# Patient Record
Sex: Male | Born: 1950 | Race: White | Hispanic: No | Marital: Married | Smoking: Never smoker
Health system: Southern US, Community
[De-identification: ages and names within clinical notes are randomized; demographics above are authoritative.]

## PROBLEM LIST (undated history)

## (undated) DIAGNOSIS — E119 Type 2 diabetes mellitus without complications: Secondary | ICD-10-CM

## (undated) DIAGNOSIS — K219 Gastro-esophageal reflux disease without esophagitis: Secondary | ICD-10-CM

## (undated) DIAGNOSIS — I10 Essential (primary) hypertension: Secondary | ICD-10-CM

## (undated) DIAGNOSIS — E785 Hyperlipidemia, unspecified: Secondary | ICD-10-CM

## (undated) HISTORY — PX: VASECTOMY: SHX75

## (undated) HISTORY — DX: Gastro-esophageal reflux disease without esophagitis: K21.9

## (undated) HISTORY — PX: CARPAL TUNNEL RELEASE: SHX101

## (undated) HISTORY — DX: Essential (primary) hypertension: I10

## (undated) HISTORY — PX: BREAST SURGERY: SHX581

## (undated) HISTORY — DX: Type 2 diabetes mellitus without complications: E11.9

## (undated) HISTORY — PX: ROTATOR CUFF REPAIR: SHX139

## (undated) HISTORY — DX: Hyperlipidemia, unspecified: E78.5

---

## 2004-06-17 DIAGNOSIS — I252 Old myocardial infarction: Secondary | ICD-10-CM

## 2004-06-17 HISTORY — DX: Old myocardial infarction: I25.2

## 2011-10-31 DIAGNOSIS — I259 Chronic ischemic heart disease, unspecified: Secondary | ICD-10-CM | POA: Insufficient documentation

## 2013-01-05 DIAGNOSIS — M25579 Pain in unspecified ankle and joints of unspecified foot: Secondary | ICD-10-CM | POA: Insufficient documentation

## 2013-01-05 DIAGNOSIS — M19079 Primary osteoarthritis, unspecified ankle and foot: Secondary | ICD-10-CM | POA: Insufficient documentation

## 2016-01-10 DIAGNOSIS — E119 Type 2 diabetes mellitus without complications: Secondary | ICD-10-CM | POA: Insufficient documentation

## 2016-01-10 DIAGNOSIS — I10 Essential (primary) hypertension: Secondary | ICD-10-CM | POA: Insufficient documentation

## 2016-01-10 DIAGNOSIS — J301 Allergic rhinitis due to pollen: Secondary | ICD-10-CM | POA: Insufficient documentation

## 2016-01-10 DIAGNOSIS — Z794 Long term (current) use of insulin: Secondary | ICD-10-CM

## 2016-01-10 DIAGNOSIS — E559 Vitamin D deficiency, unspecified: Secondary | ICD-10-CM | POA: Insufficient documentation

## 2016-01-10 DIAGNOSIS — N400 Enlarged prostate without lower urinary tract symptoms: Secondary | ICD-10-CM | POA: Insufficient documentation

## 2016-01-10 DIAGNOSIS — K219 Gastro-esophageal reflux disease without esophagitis: Secondary | ICD-10-CM | POA: Insufficient documentation

## 2016-01-10 DIAGNOSIS — G4733 Obstructive sleep apnea (adult) (pediatric): Secondary | ICD-10-CM | POA: Insufficient documentation

## 2016-01-10 DIAGNOSIS — E785 Hyperlipidemia, unspecified: Secondary | ICD-10-CM | POA: Insufficient documentation

## 2016-01-10 DIAGNOSIS — Z955 Presence of coronary angioplasty implant and graft: Secondary | ICD-10-CM | POA: Insufficient documentation

## 2017-10-14 ENCOUNTER — Ambulatory Visit: Payer: Self-pay | Admitting: Podiatry

## 2017-10-31 ENCOUNTER — Encounter: Payer: Self-pay | Admitting: Podiatry

## 2017-10-31 ENCOUNTER — Ambulatory Visit: Payer: Medicare PPO | Admitting: Podiatry

## 2017-10-31 DIAGNOSIS — M2142 Flat foot [pes planus] (acquired), left foot: Secondary | ICD-10-CM

## 2017-10-31 DIAGNOSIS — M25572 Pain in left ankle and joints of left foot: Secondary | ICD-10-CM

## 2017-10-31 DIAGNOSIS — M79675 Pain in left toe(s): Secondary | ICD-10-CM

## 2017-10-31 DIAGNOSIS — M25571 Pain in right ankle and joints of right foot: Secondary | ICD-10-CM | POA: Diagnosis not present

## 2017-10-31 DIAGNOSIS — E119 Type 2 diabetes mellitus without complications: Secondary | ICD-10-CM

## 2017-10-31 DIAGNOSIS — B351 Tinea unguium: Secondary | ICD-10-CM | POA: Diagnosis not present

## 2017-10-31 DIAGNOSIS — G8929 Other chronic pain: Secondary | ICD-10-CM | POA: Diagnosis not present

## 2017-10-31 DIAGNOSIS — M79674 Pain in right toe(s): Secondary | ICD-10-CM | POA: Diagnosis not present

## 2017-10-31 DIAGNOSIS — M2141 Flat foot [pes planus] (acquired), right foot: Secondary | ICD-10-CM

## 2017-10-31 NOTE — Progress Notes (Signed)
Subjective:    Patient ID: Todd Parks, male    DOB: 07/07/1950, 67 y.o.   MRN: [id number]  HPI 67 year old male presents the office with concerns of thick, painful, elongated toenails that he cannot trim himself.  Denies any redness or drainage or any swelling from the toenail sites.  He also has secondary concerns that he has some occasional ankle pain which is been ongoing for quite some time.  He said no recent treatment for this.  No recent injury.  He has no other concerns today.   Review of Systems  All other systems reviewed and are negative.  History reviewed. No pertinent past medical history.  History reviewed. No pertinent surgical history.   Current Outpatient Medications:  .  chlorpheniramine (CHLOR-TRIMETON) 4 MG tablet, TAKE 1 TABLET TWICE A DAY AS NEEDED, Disp: , Rfl:  .  metoprolol succinate (TOPROL-XL) 100 MG 24 hr tablet, TAKE 1 TABLET BY MOUTH EVERY DAY, Disp: , Rfl:  .  nitroGLYCERIN (NITROSTAT) 0.4 MG SL tablet, Place under the tongue., Disp: , Rfl:  .  aspirin EC 81 MG tablet, Take by mouth., Disp: , Rfl:  .  cetirizine (ZYRTEC) 10 MG tablet, Take by mouth., Disp: , Rfl:  .  Clobetasol Propionate 0.05 % shampoo, APPLY TO AFFECTED AREA EVERY DAY, Disp: , Rfl: 3 .  fenofibrate (TRICOR) 145 MG tablet, Take 145 mg by mouth daily., Disp: , Rfl: 2 .  INVOKANA 300 MG TABS tablet, Take 300 mg by mouth daily., Disp: , Rfl: 1 .  LEVEMIR FLEXTOUCH 100 UNIT/ML Pen, INJECT 35 UNITS DAILY, Disp: , Rfl: 3 .  metFORMIN (GLUCOPHAGE) 1000 MG tablet, TAKE 1 TABLET (1,000 MG TOTAL) BY MOUTH 2 TIMES DAILY WITH MEALS., Disp: , Rfl: 0 .  rosuvastatin (CRESTOR) 5 MG tablet, Take 5 mg by mouth daily., Disp: , Rfl: 0 .  valsartan (DIOVAN) 160 MG tablet, Take 160 mg by mouth daily., Disp: , Rfl: 3  No Known Allergies      Objective:   Physical Exam  General: AAO x3, NAD  Dermatological: Nails are hypertrophic, dystrophic, brittle, discolored, elongated 10. No surrounding redness  or drainage. Tenderness nails 1-5 bilaterally. No open lesions or pre-ulcerative lesions are identified today.  Vascular: Dorsalis Pedis artery and Posterior Tibial artery pedal pulses are 2/4 bilateral with immedate capillary fill time. Pedal hair growth present. No varicosities and no lower extremity edema present bilateral. There is no pain with calf compression, swelling, warmth, erythema.   Neruologic: Grossly intact via light touch bilateral.Protective threshold with Semmes Wienstein monofilament intact to all pedal sites bilateral.  Negative Tinel sign.  Musculoskeletal: Flatfoot deformity is present.  There is no area pinpoint bony tenderness pain to vibratory sensation.  Ankle joint range of motion intact however subjectively he gets some pain to the anterior aspect of the ankle joint.  There is no pain in the sinus tarsi.  There is no other area of tenderness identified except the toenails.  Muscular strength 5/5 in all groups tested bilateral.  Gait: Unassisted, Nonantalgic.     Assessment & Plan:  67 year old male with symptomatic onychomycosis, ankle pain likely result of flatfoot deformity, biomechanical changes -Treatment options discussed including all alternatives, risks, and complications -Etiology of symptoms were discussed -Nails debrided 10 without complications or bleeding.  We discussed treatment options for onychomycosis.  He elects to proceed with treatment will likely do a topical treatment. -Regards to his ankle pain I think that this is coming from his flat feet  which is quite significant.  I do recommend a more supportive shoe.  He does state that he wears a more supportive shoe he feels better we discussed the change in shoes to help with this. -Daily foot inspection -Follow-up in 9 weeks or sooner if any problems arise. In the meantime, encouraged to call the office with any questions, concerns, change in symptoms.   Ovid Curd, DPM

## 2017-10-31 NOTE — Patient Instructions (Signed)

## 2017-11-26 ENCOUNTER — Telehealth: Payer: Self-pay | Admitting: Podiatry

## 2017-11-26 MED ORDER — NONFORMULARY OR COMPOUNDED ITEM: 2 refills | Status: DC

## 2017-11-26 NOTE — Telephone Encounter (Signed)
Or[facility] faxed to [facility] for onychomycosis nail lacquer [facility] order.

## 2017-11-26 NOTE — Telephone Encounter (Signed)
This is Todd CriglerDiana Parks calling for my husband. Dr. [name] was supposed to order some cream that goes on his toenails and it is supposed to be delivered to our house. He still has not received it. Could you please check into what is going on and call him back at [phone] and let him know what is going on? Thank you. Goodbye.

## 2017-11-26 NOTE — Addendum Note (Signed)
Addended by: Alphia Kava'CONNELL, VALERY D on: 11/26/2017 11:53 AM   Modules accepted: Orders

## 2017-11-26 NOTE — Telephone Encounter (Signed)
I informed pt and wife, I had not been given the nail lacquer order, so I would fax it now as a [facility] order and I gave pt's wife the [facility] [phone].

## 2018-01-06 ENCOUNTER — Ambulatory Visit: Payer: Medicare PPO | Admitting: Podiatry

## 2018-01-06 ENCOUNTER — Encounter: Payer: Self-pay | Admitting: Podiatry

## 2018-01-06 DIAGNOSIS — B351 Tinea unguium: Secondary | ICD-10-CM

## 2018-01-06 DIAGNOSIS — M2141 Flat foot [pes planus] (acquired), right foot: Secondary | ICD-10-CM | POA: Diagnosis not present

## 2018-01-06 DIAGNOSIS — M2142 Flat foot [pes planus] (acquired), left foot: Secondary | ICD-10-CM

## 2018-01-06 DIAGNOSIS — M25572 Pain in left ankle and joints of left foot: Secondary | ICD-10-CM

## 2018-01-06 DIAGNOSIS — E119 Type 2 diabetes mellitus without complications: Secondary | ICD-10-CM

## 2018-01-06 DIAGNOSIS — M79674 Pain in right toe(s): Secondary | ICD-10-CM | POA: Diagnosis not present

## 2018-01-06 DIAGNOSIS — M79675 Pain in left toe(s): Secondary | ICD-10-CM | POA: Diagnosis not present

## 2018-01-06 DIAGNOSIS — G8929 Other chronic pain: Secondary | ICD-10-CM

## 2018-01-06 DIAGNOSIS — M25571 Pain in right ankle and joints of right foot: Secondary | ICD-10-CM

## 2018-01-07 NOTE — Progress Notes (Signed)
Subjective: [age] year old male presents the office today for follow-up evaluation of ankle pain.  He states he purchase new shoes and this is because his ankle swelling quite some time he is very happy with the pain improvement.  He also presents today for concerns of thick, painful, elongated toenails that he cannot trim himself.  Denies any redness or drainage or any swelling to the toenails.  He has no new concerns otherwise. Denies any systemic complaints such as fevers, chills, nausea, vomiting. No acute changes since last appointment, and no other complaints at this time.   Objective: AAO x3, NAD DP/PT pulses palpable bilaterally, CRT less than 3 seconds Toes are hypertrophic, dystrophic, discolored, elongated x10.  Tenderness nails 1-5 bilaterally.  No surrounding redness or drainage or any signs of infection. There is decrease in medial arch upon weightbearing.  This time there is no tenderness palpation of the ankle and there is no pain with ankle or subtalar joint range of motion. No open lesions or pre-ulcerative lesions.  No pain with calf compression, swelling, warmth, erythema  Assessment: Resolving ankle pain with flatfoot deformity; symptomatic onychomycosis  Plan: -All treatment options discussed with the patient including all alternatives, risks, complications.  -Ingal is doing much better.  We discussed custom orthotics reason to consider this and if he wants to do them he is on call the office to make an appointment with Raiford Nobleick.  This is before supper deformity and ankle pain.  So far the change in shoes has been good for him. -Debridement nails x10 without any complications or bleeding. -Patient encouraged to call the office with any questions, concerns, change in symptoms.   Vivi BarrackMatthew R Wagoner DPM

## 2018-03-10 ENCOUNTER — Ambulatory Visit: Payer: Medicare PPO | Admitting: Podiatry

## 2018-03-10 DIAGNOSIS — M79674 Pain in right toe(s): Secondary | ICD-10-CM | POA: Diagnosis not present

## 2018-03-10 DIAGNOSIS — E119 Type 2 diabetes mellitus without complications: Secondary | ICD-10-CM

## 2018-03-10 DIAGNOSIS — B351 Tinea unguium: Secondary | ICD-10-CM

## 2018-03-10 DIAGNOSIS — M79675 Pain in left toe(s): Secondary | ICD-10-CM

## 2018-03-10 NOTE — Progress Notes (Signed)
Subjective: 67 year old male presents the office today for concerns of thick, painful, elongated toenails that he cannot trim himself.  Denies any redness or drainage or any swelling to the toenails.  He has no new concerns otherwise. Denies any systemic complaints such as fevers, chills, nausea, vomiting. No acute changes since last appointment, and no other complaints at this time.   Objective: AAO x3, NAD DP/PT pulses palpable bilaterally, CRT less than 3 seconds Toes are hypertrophic, dystrophic, discolored, elongated x10.  Tenderness nails 1-5 bilaterally.  No surrounding redness or drainage or any signs of infection. There is decrease in medial arch upon weightbearing.  This time there is no tenderness palpation of the ankle and there is no pain with ankle or subtalar joint range of motion. Overall his foot pain has much improved.  No open lesions or pre-ulcerative lesions.  No pain with calf compression, swelling, warmth, erythema  Assessment: Resolving ankle pain with flatfoot deformity; symptomatic onychomycosis  Plan: -All treatment options discussed with the patient including all alternatives, risks, complications.  -Debridement nails x10 without any complications or bleeding. -Will check orthotic coverage due to flatfoot/diabetes -Patient encouraged to call the office with any questions, concerns, change in symptoms.   Vivi BarrackMatthew R Wagoner DPM

## 2018-05-12 ENCOUNTER — Encounter: Payer: Self-pay | Admitting: Podiatry

## 2018-05-12 ENCOUNTER — Ambulatory Visit: Payer: Medicare PPO | Admitting: Podiatry

## 2018-05-12 ENCOUNTER — Encounter (INDEPENDENT_AMBULATORY_CARE_PROVIDER_SITE_OTHER): Payer: Self-pay

## 2018-05-12 ENCOUNTER — Ambulatory Visit (INDEPENDENT_AMBULATORY_CARE_PROVIDER_SITE_OTHER): Payer: Medicare PPO

## 2018-05-12 DIAGNOSIS — M79675 Pain in left toe(s): Secondary | ICD-10-CM

## 2018-05-12 DIAGNOSIS — M779 Enthesopathy, unspecified: Secondary | ICD-10-CM

## 2018-05-12 DIAGNOSIS — M7752 Other enthesopathy of left foot: Secondary | ICD-10-CM | POA: Diagnosis not present

## 2018-05-12 DIAGNOSIS — M19072 Primary osteoarthritis, left ankle and foot: Secondary | ICD-10-CM

## 2018-05-12 DIAGNOSIS — M79674 Pain in right toe(s): Secondary | ICD-10-CM

## 2018-05-12 DIAGNOSIS — E119 Type 2 diabetes mellitus without complications: Secondary | ICD-10-CM

## 2018-05-12 DIAGNOSIS — B351 Tinea unguium: Secondary | ICD-10-CM

## 2018-05-12 MED ORDER — TRIAMCINOLONE ACETONIDE 10 MG/ML IJ SUSP
10.0000 mg | Freq: Once | INTRAMUSCULAR | Status: AC
  Administered 2018-05-12: 10 mg

## 2018-05-12 NOTE — Progress Notes (Signed)
Subjective: 67 year old male presents the office today for concerns of thick, painful, elongated toenails that he cannot trim himself.  Denies any redness or drainage or any swelling to the toenails.  He also states his been having some pain to the left ankle.  He states that he was wearing his orthotics in his shoes and he is doing good but unfortunately worsened dress shoes at a funeral and since he has had pain he points on the anterior ankle joint line.  Denies any recent injury or trauma.  He has no new concerns otherwise. Denies any systemic complaints such as fevers, chills, nausea, vomiting. No acute changes since last appointment, and no other complaints at this time.   Objective: AAO x3, NAD DP/PT pulses palpable bilaterally, CRT less than 3 seconds Toes are hypertrophic, dystrophic, discolored, elongated x10.  Tenderness nails 1-5 bilaterally.  No surrounding redness or drainage or any signs of infection. Tenderness on the anterior ankle joint along along both anterior medial anterior lateral gutters of the ankle.  There is no pain or crepitation with ankle joint range of motion.  Absent range of motion of the subtalar joint.  No other areas of tenderness. No open lesions or pre-ulcerative lesions.  No pain with calf compression, swelling, warmth, erythema  Assessment: Capsulitis left ankle; symptomatic onychomycosis  Plan: -All treatment options discussed with the patient including all alternatives, risks, complications.  -X-rays obtained reviewed.  Significant arthritic changes present in the subtalar joint.  No evidence of acute fracture identified. -Patient is requesting steroid injection of the left ankle.  See procedure note below.  He has an ankle brace at home and I want him to wear this. -Debridement nails x10 without any complications or bleeding. -He still think about orthotics I think long-term this would be helpful. -Patient encouraged to call the office with any  questions, concerns, change in symptoms.   Procedure: Injection intermediate joint Discussed alternatives, risks, complications and verbal consent was obtained.  Location: Left ankle anterior lateral approach Skin Prep: Betadine. Injectate: 0.5cc 0.5% marcaine plain, 0.5 cc 2% lidocaine plain and, 1 cc kenalog 10. Disposition: Patient tolerated procedure well. Injection site dressed with a band-aid.  Post-injection care was discussed and return precautions discussed.    Vivi BarrackMatthew R Wagoner DPM

## 2018-07-14 ENCOUNTER — Telehealth: Payer: Self-pay | Admitting: *Deleted

## 2018-07-14 ENCOUNTER — Ambulatory Visit: Payer: Medicare PPO | Admitting: Podiatry

## 2018-07-14 DIAGNOSIS — M19072 Primary osteoarthritis, left ankle and foot: Secondary | ICD-10-CM | POA: Diagnosis not present

## 2018-07-14 DIAGNOSIS — B351 Tinea unguium: Secondary | ICD-10-CM | POA: Diagnosis not present

## 2018-07-14 DIAGNOSIS — M7752 Other enthesopathy of left foot: Secondary | ICD-10-CM | POA: Diagnosis not present

## 2018-07-14 DIAGNOSIS — M79674 Pain in right toe(s): Secondary | ICD-10-CM

## 2018-07-14 DIAGNOSIS — M79675 Pain in left toe(s): Secondary | ICD-10-CM

## 2018-07-14 DIAGNOSIS — E119 Type 2 diabetes mellitus without complications: Secondary | ICD-10-CM | POA: Diagnosis not present

## 2018-07-14 NOTE — Telephone Encounter (Signed)
-----   Message from Vivi Barrack, DPM sent at 07/14/2018  9:46 AM EST ----- Can you please order an MRI of the left ankle due to chronic pain/arthritis. This is for surgical planning. Thanks.

## 2018-07-14 NOTE — Progress Notes (Signed)
Subjective: 68 year old male presents the office today for follow-up evaluation of left ankle pain.  States the injection helped for about 1.5 days and the pain started to come back.  He states that his ankle hurts quite a bit the morning he is on it but gets better with rest he has no pain today.  He states that he also has quite a bit of pain on the outside aspect of the foot if he does side to side motions or goes up a hill.  This is been a chronic issue for him he was discussed other treatment options.  Also his nails are thickened elongated and cause pain with pressure in shoes.  Denies any redness or drainage from the toenail sites.Denies any systemic complaints such as fevers, chills, nausea, vomiting. No acute changes since last appointment, and no other complaints at this time.   Objective: AAO x3, NAD DP/PT pulses palpable bilaterally, CRT less than 3 seconds There is decreased range of motion of the subtalar joint.  There is tenderness subjectively along the anterior ankle joint line there is no tenderness today.  Flexor, extensor tendons appear to be intact.  There is no edema, erythema. Nails are hypertrophic, dystrophic with yellow-brown discoloration.  Tenderness nails 1-5 bilaterally.  No edema, erythema, drainage or pus or any clinical signs of infection noted today. No open lesions or pre-ulcerative lesions.  No pain with calf compression, swelling, warmth, erythema  Assessment: 68 year old male with left ankle capsulitis, arthritis with subtalar joint arthritis; symptomatic onychomycosis  Plan: -All treatment options discussed with the patient including all alternatives, risks, complications.  -Sharp debrided the nails x10 without any complications or bleeding. -At this time given his continuation of pain will order an MRI of the left ankle.  This is for surgical planning.  He has had chronic pain to his left ankle despite numerous treatments he continues to have pain. -Patient  encouraged to call the office with any questions, concerns, change in symptoms.   Vivi Barrack DPM

## 2018-07-14 NOTE — Telephone Encounter (Signed)
Orders to J. Quintana, RN for pre-cert, faxed to [facility]. 

## 2018-07-27 ENCOUNTER — Ambulatory Visit
Admission: RE | Admit: 2018-07-27 | Discharge: 2018-07-27 | Disposition: A | Discharge status: Home / Self Care | Payer: Medicare PPO | Source: Ambulatory Visit | Attending: Podiatry | Admitting: Podiatry

## 2018-08-06 ENCOUNTER — Telehealth: Payer: Self-pay | Admitting: *Deleted

## 2018-08-06 NOTE — Telephone Encounter (Signed)
-----   Message from Vivi Barrack, DPM sent at 08/05/2018  5:12 PM EST ----- Val- please let him know the MRI does show significant arthritis in the joint below the ankle (which we alrady know). It didn't comment much about the ankle. Can you please send for an overread and please let him know that we are going to do that? Thanks.

## 2018-08-06 NOTE — Telephone Encounter (Signed)
I informed pt of Dr. Gabriel Rung review of results and request to send copy of the MRI disc to a radiology specialist for evaluation of the ankle and more details treatment planning, there would be a 10-15 day delay in final results and I would call again with instructions once final results are received.

## 2018-08-06 NOTE — Telephone Encounter (Signed)
Mailed copy of MRI to [facility].

## 2018-08-17 ENCOUNTER — Encounter: Payer: Self-pay | Admitting: Podiatry

## 2018-08-19 ENCOUNTER — Telehealth: Payer: Self-pay | Admitting: Podiatry

## 2018-08-19 NOTE — Telephone Encounter (Signed)
Dr Germaine Pomfret called patient  yesterday. Return call from patient.  I informed him  that Dr was in surgery today and he stated that he should await a reply from you at [employer].

## 2018-08-20 NOTE — Telephone Encounter (Signed)
I called him to go over the results.   Marchelle Folks, can you please schedule him for a steroid injection?

## 2018-08-31 ENCOUNTER — Ambulatory Visit: Payer: Medicare PPO | Admitting: Podiatry

## 2018-08-31 ENCOUNTER — Other Ambulatory Visit: Payer: Self-pay

## 2018-08-31 ENCOUNTER — Encounter: Payer: Self-pay | Admitting: Podiatry

## 2018-08-31 DIAGNOSIS — M19072 Primary osteoarthritis, left ankle and foot: Secondary | ICD-10-CM

## 2018-08-31 DIAGNOSIS — M7752 Other enthesopathy of left foot: Secondary | ICD-10-CM | POA: Diagnosis not present

## 2018-08-31 NOTE — Progress Notes (Signed)
Subjective: 68 year old male presents the office today for steroid injections in his left subtalar joint.  I discussed with him over the phone about doing this.  He is interested in having subtalar joint fusion but not until at least later this year or early next year.  I would like to do a steroid injection to see if this helps improve his symptoms and also help with some pain relief. Denies any systemic complaints such as fevers, chills, nausea, vomiting. No acute changes since last appointment, and no other complaints at this time.   Objective: AAO x3, NAD DP/PT pulses palpable bilaterally, CRT less than 3 seconds Significant restriction subtalar range of motion and there is tenderness on the lateral aspect along sinus tarsi.  No edema there is no significant erythema increased warmth ankle joint.  No other areas of tenderness identified. No open lesions. No pain with calf compression, swelling, warmth, erythema.  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  -Steroid injection left subtalar joint.  See procedure note below. -Ankle brace as needed -Patient encouraged to call the office with any questions, concerns, change in symptoms.   Procedure: Injection intermediate joint Discussed alternatives, risks, complications and verbal consent was obtained.  Location: Left sinus tarsi Skin Prep: Betadine  Injectate: 0.5cc 0.5% marcaine plain, 0.5 cc 2% lidocaine plain and, 1 cc kenalog 10. Disposition: Patient tolerated procedure well. Injection site dressed with a band-aid.  Post-injection care was discussed and return precautions discussed.   Vivi Barrack DPM

## 2018-10-13 ENCOUNTER — Ambulatory Visit: Payer: Medicare PPO | Admitting: Podiatry

## 2018-10-13 ENCOUNTER — Other Ambulatory Visit: Payer: Self-pay

## 2018-10-13 ENCOUNTER — Encounter: Payer: Self-pay | Admitting: Podiatry

## 2018-10-13 VITALS — Temp 97.5°F

## 2018-10-13 DIAGNOSIS — M79674 Pain in right toe(s): Secondary | ICD-10-CM

## 2018-10-13 DIAGNOSIS — B351 Tinea unguium: Secondary | ICD-10-CM | POA: Diagnosis not present

## 2018-10-13 DIAGNOSIS — M79675 Pain in left toe(s): Secondary | ICD-10-CM

## 2018-10-13 DIAGNOSIS — E119 Type 2 diabetes mellitus without complications: Secondary | ICD-10-CM | POA: Diagnosis not present

## 2018-10-13 NOTE — Progress Notes (Signed)
Subjective: 68 year old male presents the office today for concerns of thick, discolored toenails that he cannot trim himself as well as calluses. He also states that his left foot is still hurting.  He does not want proceed with her injection because he gets increased pain afterwards.  He is also tried the braces is not been helpful and wants to consider surgery for his left foot in the future.  His wife is having surgery on June 1 and wants to wait till her back surgery is complete.  Still gets pain on a daily basis. Denies any systemic complaints such as fevers, chills, nausea, vomiting. No acute changes since last appointment, and no other complaints at this time.   Objective: AAO x3, NAD DP/PT pulses palpable bilaterally, CRT less than 3 seconds Nails are hypertrophic, dystrophic, brittle, discolored, elongated 10. No surrounding redness or drainage. Tenderness nails 1-5 bilaterally. No open lesions or pre-ulcerative lesions are identified today. Hyperkeratotic lesion submetatarsal 1 bilaterally both medial hallux.  No ongoing ulceration.  Decreased range of motion of the subtalar joint left side with tenderness palpation of the sinus tarsi and with subtalar joint range of motion.  Minimal discomfort but no joint.  Mild discomfort in the plantar aspect of the heel. No open lesions or pre-ulcerative lesions.  No pain with calf compression, swelling, warmth, erythema  Assessment: 68 year old male with symptomatic onychomycosis, Hyperkeratotic lesion, subtalar joint arthritis  Plan: -All treatment options discussed with the patient including all alternatives, risks, complications.  -Nails debrided x10 without any complications or bleeding -Hyperkeratotic lesion sub-debrided.  Small nonbleeding right foot metatarsal 1.  No signs of infection.  Areas cleaned with alcohol.  Antibiotic ointment and bandages applied.  Monitoring signs or symptoms of infection. -Discussed left foot pain. He wants to  hold off on another injection. Discussed STJ fusion but he wants to wait until his wife has her surgery. Will discuss further next appointment.  -Patient encouraged to call the office with any questions, concerns, change in symptoms.   Vivi Barrack DPM

## 2019-01-12 ENCOUNTER — Ambulatory Visit: Payer: Medicare PPO | Admitting: Podiatry

## 2019-01-19 ENCOUNTER — Other Ambulatory Visit: Payer: Self-pay

## 2019-01-19 ENCOUNTER — Encounter: Payer: Self-pay | Admitting: Podiatry

## 2019-01-19 ENCOUNTER — Ambulatory Visit: Payer: Medicare PPO | Admitting: Podiatry

## 2019-01-19 VITALS — Temp 97.4°F

## 2019-01-19 DIAGNOSIS — M79674 Pain in right toe(s): Secondary | ICD-10-CM | POA: Diagnosis not present

## 2019-01-19 DIAGNOSIS — Q828 Other specified congenital malformations of skin: Secondary | ICD-10-CM

## 2019-01-19 DIAGNOSIS — M79675 Pain in left toe(s): Secondary | ICD-10-CM

## 2019-01-19 DIAGNOSIS — B351 Tinea unguium: Secondary | ICD-10-CM

## 2019-01-19 DIAGNOSIS — E119 Type 2 diabetes mellitus without complications: Secondary | ICD-10-CM | POA: Diagnosis not present

## 2019-01-20 NOTE — Progress Notes (Signed)
Subjective: 68 year old male presents the office today for concerns of thick, discolored toenails that he cannot trim himself as well as calluses. He also states that his left foot is still hurting however he has been on Celebrex for back issue which is been helping the foot.  He wants to consider surgery later this year.  No recent changes otherwise no recent injuries to his lower extremities.Denies any systemic complaints such as fevers, chills, nausea, vomiting. No acute changes since last appointment, and no other complaints at this time.   Objective: AAO x3, NAD DP/PT pulses palpable bilaterally, CRT less than 3 seconds Nails are hypertrophic, dystrophic, brittle, discolored, elongated 10. No surrounding redness or drainage. Tenderness nails 1-5 bilaterally. No open lesions or pre-ulcerative lesions are identified today. Hyperkeratotic lesion submetatarsal 1 bilaterally both medial hallux.  No ongoing ulceration.   Decreased range of motion of the subtalar joint left side with tenderness palpation of the sinus tarsi and with subtalar joint range of motion.  Minimal discomfort but no joint.  Mild discomfort in the plantar aspect of the heel.  Overall improved as of now as he is on Celebrex. No open lesions or pre-ulcerative lesions.  No pain with calf compression, swelling, warmth, erythema  Assessment: 68 year old male with symptomatic onychomycosis, Hyperkeratotic lesion, subtalar joint arthritis  Plan: -All treatment options discussed with the patient including all alternatives, risks, complications.  -Nails debrided x10 without any complications or bleeding -Hyperkeratotic lesion sub-debrided.  Small nonbleeding right foot metatarsal 1.  No signs of infection.  Areas cleaned with alcohol.  Antibiotic ointment and bandages applied.  Monitoring signs or symptoms of infection. -Consider surgical invention, STJ fusion.  We discussed this.  Will consider doing this later this year. -Patient  encouraged to call the office with any questions, concerns, change in symptoms.   Trula Slade DPM

## 2019-01-21 DIAGNOSIS — M545 Low back pain, unspecified: Secondary | ICD-10-CM | POA: Insufficient documentation

## 2019-01-25 DIAGNOSIS — M47816 Spondylosis without myelopathy or radiculopathy, lumbar region: Secondary | ICD-10-CM | POA: Insufficient documentation

## 2019-04-16 ENCOUNTER — Other Ambulatory Visit: Payer: Self-pay

## 2019-04-16 DIAGNOSIS — Z20822 Contact with and (suspected) exposure to covid-19: Secondary | ICD-10-CM

## 2019-04-17 LAB — NOVEL CORONAVIRUS, NAA: SARS-CoV-2, NAA: NOT DETECTED

## 2019-04-27 ENCOUNTER — Encounter: Payer: Self-pay | Admitting: Podiatry

## 2019-04-27 ENCOUNTER — Ambulatory Visit: Payer: Medicare PPO | Admitting: Podiatry

## 2019-04-27 ENCOUNTER — Other Ambulatory Visit: Payer: Self-pay | Admitting: *Deleted

## 2019-04-27 ENCOUNTER — Other Ambulatory Visit: Payer: Self-pay

## 2019-04-27 DIAGNOSIS — E119 Type 2 diabetes mellitus without complications: Secondary | ICD-10-CM | POA: Diagnosis not present

## 2019-04-27 DIAGNOSIS — B351 Tinea unguium: Secondary | ICD-10-CM

## 2019-04-27 DIAGNOSIS — M79674 Pain in right toe(s): Secondary | ICD-10-CM

## 2019-04-27 DIAGNOSIS — Z01812 Encounter for preprocedural laboratory examination: Secondary | ICD-10-CM | POA: Diagnosis not present

## 2019-04-27 DIAGNOSIS — M19072 Primary osteoarthritis, left ankle and foot: Secondary | ICD-10-CM | POA: Diagnosis not present

## 2019-04-27 DIAGNOSIS — M79675 Pain in left toe(s): Secondary | ICD-10-CM

## 2019-04-27 DIAGNOSIS — M2141 Flat foot [pes planus] (acquired), right foot: Secondary | ICD-10-CM | POA: Diagnosis not present

## 2019-04-27 DIAGNOSIS — M2142 Flat foot [pes planus] (acquired), left foot: Secondary | ICD-10-CM

## 2019-04-27 DIAGNOSIS — M150 Primary generalized (osteo)arthritis: Secondary | ICD-10-CM

## 2019-04-27 NOTE — Progress Notes (Signed)
I sent a request to Jolee Ewing, [place], requesting assistance in getting a knee scooter for Mr. Dominic.  He's scheduled for surgery on 05/26/2019.

## 2019-04-27 NOTE — Patient Instructions (Signed)
Pre-Operative Instructions  Congratulations, you have decided to take an important step towards improving your quality of life.  You can be assured that the doctors and staff at [facility] will be with you every step of the way.  Here are some important things you should know:  1. Plan to be at the surgery center/hospital at least 1 (one) hour prior to your scheduled time, unless otherwise directed by the surgical center/hospital staff.  You must have a responsible adult accompany you, remain during the surgery and drive you home.  Make sure you have directions to the surgical center/hospital to ensure you arrive on time. 2. If you are having surgery at [facility] or [facility] hospitals, you will need a copy of your medical history and physical form from your family physician within one month prior to the date of surgery. We will give you a form for your primary physician to complete.  3. We make every effort to accommodate the date you request for surgery.  However, there are times where surgery dates or times have to be moved.  We will contact you as soon as possible if a change in schedule is required.   4. No aspirin/ibuprofen for one week before surgery.  If you are on aspirin, any non-steroidal anti-inflammatory medications (Mobic, Aleve, Ibuprofen) should not be taken seven (7) days prior to your surgery.  You make take Tylenol for pain prior to surgery.  5. Medications - If you are taking daily heart and blood pressure medications, seizure, reflux, allergy, asthma, anxiety, pain or diabetes medications, make sure you notify the surgery center/hospital before the day of surgery so they can tell you which medications you should take or avoid the day of surgery. 6. No food or drink after midnight the night before surgery unless directed otherwise by surgical center/hospital staff. 7. No alcoholic beverages 24-hours prior to surgery.  No smoking 24-hours prior or 24-hours after  surgery. 8. Wear loose pants or shorts. They should be loose enough to fit over bandages, boots, and casts. 9. Don't wear slip-on shoes. Sneakers are preferred. 10. Bring your boot with you to the surgery center/hospital.  Also bring crutches or a walker if your physician has prescribed it for you.  If you do not have this equipment, it will be provided for you after surgery. 11. If you have not been contacted by the surgery center/hospital by the day before your surgery, call to confirm the date and time of your surgery. 12. Leave-time from work may vary depending on the type of surgery you have.  Appropriate arrangements should be made prior to surgery with your employer. 13. Prescriptions will be provided immediately following surgery by your doctor.  Fill these as soon as possible after surgery and take the medication as directed. Pain medications will not be refilled on weekends and must be approved by the doctor. 14. Remove nail polish on the operative foot and avoid getting pedicures prior to surgery. 15. Wash the night before surgery.  The night before surgery wash the foot and leg well with water and the antibacterial soap provided. Be sure to pay special attention to beneath the toenails and in between the toes.  Wash for at least three (3) minutes. Rinse thoroughly with water and dry well with a towel.  Perform this wash unless told not to do so by your physician.  Enclosed: 1 Ice pack (please put in freezer the night before surgery)   1 Hibiclens skin cleaner     Pre-op instructions  If you have any questions regarding the instructions, please do not hesitate to call our office.  [place]: [address], [place], [place] [address] -- [phone]  [place]: [address] [place], [place] [address] -- [phone]  [place]: [address]  [place], [place] [address] -- [phone]   Website: [email or web address] 

## 2019-05-04 ENCOUNTER — Encounter: Payer: Self-pay | Admitting: *Deleted

## 2019-05-04 NOTE — Progress Notes (Signed)
Subjective: 68 year old male with past medical history consistent for vitamin D deficiency, type 2 diabetes presents the office today for surgical consultation given continued pain to his left foot.  He states that anytime he is on uneven surfaces he gets quite a bit of pinpoint the lateral aspect of his foot on the sinus tarsi.  He is attempted shoe modifications, bracing and this is been ongoing for quite some time.  Given the progressive nature of the pain he wants to go and proceed with surgery.  Also asking for his nails be trimmed today they are thick and elongated he cannot trim them himself. Denies any systemic complaints such as fevers, chills, nausea, vomiting. No acute changes since last appointment, and no other complaints at this time.   Objective: AAO x3, NAD DP/PT pulses palpable bilaterally, CRT less than 3 seconds Nails are hypertrophic, dystrophic, discolored with yellow-brown discoloration.  Tenderness nails 1-5 bilaterally.  No drainage or pus or any signs of infection.   There is tenderness to palpation on the lateral aspect of the foot on the sinus tarsi.  Flatfoot deformities present.  There is decrease in the motion of the subtalar joint.  No significant discomfort to palpation of the ankle joint itself there is no pain or crepitation with ankle joint range of motion.  There is no other areas of tenderness. No open lesions or pre-ulcerative lesions.  No pain with calf compression, swelling, warmth, erythema  Assessment: Left subtalar joint arthritis, symptomatic onychomycosis  Plan: -All treatment options discussed with the patient including all alternatives, risks, complications.  -Toenail sharply debrided x10 without any complications or bleeding -Reviewed the x-rays as well as the previous MRI.  We discussed subtalar joint arthrodesis.  Discussed the surgery was postoperative course and was proceed. -The incision placement as well as the postoperative course was discussed  with the patient. I discussed risks of the surgery which include, but not limited to, infection, bleeding, pain, swelling, need for further surgery, delayed or nonhealing, painful or ugly scar, numbness or sensation changes, over/under correction, recurrence, transfer lesions, further deformity, hardware failure, DVT/PE, loss of toe/foot. Patient understands these risks and wishes to proceed with surgery. The surgical consent was reviewed with the patient all 3 pages were signed. No promises or guarantees were given to the outcome of the procedure. All questions were answered to the best of my ability. Before the surgery the patient was encouraged to call the office if there is any further questions. The surgery will be performed at the [facility] on an outpatient basis. -Blood work ordered including vitamin D, A1c, CBC, CMP -Patient encouraged to call the office with any questions, concerns, change in symptoms.   Trula Slade DPM

## 2019-05-04 NOTE — Progress Notes (Unsigned)
Per Dr. Jacqualyn Posey, I sent Dr. Loa Socks a surgical medical clearance request letter.  Todd Parks is scheduled for surgery on 05/26/2019.

## 2019-05-12 ENCOUNTER — Telehealth: Payer: Self-pay | Admitting: *Deleted

## 2019-05-12 NOTE — Telephone Encounter (Signed)
DOS 05/26/2019 SUBTALAR ARTHRODESIS LEFT FOOT - 10175  [employer]: Eligibility Date -    Co-Payment - Surgical  In Network Individual  [facility] [place] INCLUDED IN OOP  $200.00 Visit   Co-Insurance - Surgical  In [place]  [facility] Surgery  0 %   Deductible - Health Benefit Plan Coverage  Network Not Applicable Individual  Plan / Coverage Date Jun 17, 2018 - Jun 17, 2019  Benefit Date Jun 17, 2018 - Jun 17, 2019 $0.00 Calendar Year  - $0.00 Year to Date  $0.00 Remaining    Out of [place]ocket (Stop Loss) - Health Benefit Plan Coverage  In Network Individual  Plan / Coverage Date Jun 17, 2018 - Jun 17, 2019  Benefit Date Jun 17, 2018 - Jun 17, 2019 $4,200.00   In Network Individual  [phone] Remaining

## 2019-05-17 ENCOUNTER — Encounter: Payer: Self-pay | Admitting: Podiatry

## 2019-05-17 NOTE — Telephone Encounter (Signed)
I called and spoke to [facility] at [facility].  He stated authorization is NOT REQUIRED. Call ref# [id number]

## 2019-05-19 LAB — COMPLETE METABOLIC PANEL WITH GFR
AG Ratio: 1.9 (calc) (ref 1.0–2.5)
ALT: 26 U/L (ref 9–46)
AST: 21 U/L (ref 10–35)
Albumin: 4.7 g/dL (ref 3.6–5.1)
Alkaline phosphatase (APISO): 33 U/L — ABNORMAL LOW (ref 35–144)
BUN/Creatinine Ratio: 12 (calc) (ref 6–22)
BUN: 15 mg/dL (ref 7–25)
CO2: 27 mmol/L (ref 20–32)
Calcium: 9.7 mg/dL (ref 8.6–10.3)
Chloride: 103 mmol/L (ref 98–110)
Creat: 1.29 mg/dL — ABNORMAL HIGH (ref 0.70–1.25)
GFR, Est African American: 66 mL/min/{1.73_m2} (ref >=60)
GFR, Est Non African American: 57 mL/min/{1.73_m2} — ABNORMAL LOW (ref >=60)
Globulin: 2.5 g/dL (calc) (ref 1.9–3.7)
Glucose, Bld: 122 mg/dL — ABNORMAL HIGH (ref 65–99)
Potassium: 4.4 mmol/L (ref 3.5–5.3)
Sodium: 140 mmol/L (ref 135–146)
Total Bilirubin: 0.5 mg/dL (ref 0.2–1.2)
Total Protein: 7.2 g/dL (ref 6.1–8.1)

## 2019-05-19 LAB — CBC WITH DIFFERENTIAL/PLATELET
Absolute Monocytes: 372 cells/uL (ref 200–950)
Basophils Absolute: 39 cells/uL (ref 0–200)
Basophils Relative: 0.8 %
Eosinophils Absolute: 108 cells/uL (ref 15–500)
Eosinophils Relative: 2.2 %
HCT: 43.3 % (ref 38.5–50.0)
Hemoglobin: 14.6 g/dL (ref 13.2–17.1)
Lymphs Abs: 1803 cells/uL (ref 850–3900)
MCH: 29.6 pg (ref 27.0–33.0)
MCHC: 33.7 g/dL (ref 32.0–36.0)
MCV: 87.8 fL (ref 80.0–100.0)
MPV: 10.5 fL (ref 7.5–12.5)
Monocytes Relative: 7.6 %
Neutro Abs: 2577 cells/uL (ref 1500–7800)
Neutrophils Relative %: 52.6 %
Platelets: 198 10*3/uL (ref 140–400)
RBC: 4.93 10*6/uL (ref 4.20–5.80)
RDW: 13 % (ref 11.0–15.0)
Total Lymphocyte: 36.8 %
WBC: 4.9 10*3/uL (ref 3.8–10.8)

## 2019-05-19 LAB — HEMOGLOBIN A1C
Hgb A1c MFr Bld: 6.4 % of total Hgb — ABNORMAL HIGH (ref <5.7)
Mean Plasma Glucose: 137 (calc)
eAG (mmol/L): 7.6 (calc)

## 2019-05-19 LAB — VITAMIN D 25 HYDROXY (VIT D DEFICIENCY, FRACTURES): Vit D, 25-Hydroxy: 39 ng/mL (ref 30–100)

## 2019-05-21 ENCOUNTER — Telehealth: Payer: Self-pay | Admitting: *Deleted

## 2019-05-21 NOTE — Telephone Encounter (Signed)
We received medical clearance from Dr. Loa Socks.  He said for [name] to hold his Levemir Insulin, Invokana, and Metformin the day of surgery.  I called and informed [name] of Dr. Madlyn Frankel instructions.  He said he would do it.  He said he hasn't taken the Aspirin in about a week now on advise of someone from the surgery center.

## 2019-05-24 ENCOUNTER — Telehealth: Payer: Self-pay | Admitting: *Deleted

## 2019-05-24 NOTE — Telephone Encounter (Addendum)
"  [name] has called her and stated that we are supposed to have a wheel chair here for him to use after he has his surgery.  Do you know anything about that?"  He's supposed to have a knee scooter.  I put in an order with [facility].  Have they not contacted him.  "I don't know but I don't think he's going to like using a knee scooter."  I'll give him a call.   I received a call from the surgical center.  I was informed that you were inquiring about a wheel chair.  "I absolutely did not call them.  I have a knee cycle or whatever you call it.  Dr. Jacqualyn Posey suggested I use it.  I'm doing what the doctor told me to do."  Okay, that's great.  I hope all goes well with your surgery.

## 2019-05-26 ENCOUNTER — Other Ambulatory Visit: Payer: Self-pay | Admitting: Podiatry

## 2019-05-26 ENCOUNTER — Encounter: Payer: Self-pay | Admitting: Podiatry

## 2019-05-26 ENCOUNTER — Telehealth: Payer: Self-pay | Admitting: *Deleted

## 2019-05-26 DIAGNOSIS — S86392A Other injury of muscle(s) and tendon(s) of peroneal muscle group at lower leg level, left leg, initial encounter: Secondary | ICD-10-CM

## 2019-05-26 DIAGNOSIS — M05572 Rheumatoid polyneuropathy with rheumatoid arthritis of left ankle and foot: Secondary | ICD-10-CM

## 2019-05-26 MED ORDER — PROMETHAZINE HCL 25 MG PO TABS: 25.0000 mg | ORAL_TABLET | Freq: Three times a day (TID) | ORAL | 0 refills | Status: DC | PRN

## 2019-05-26 MED ORDER — OXYCODONE-ACETAMINOPHEN 5-325 MG PO TABS: 1.0000 | ORAL_TABLET | Freq: Four times a day (QID) | ORAL | 0 refills | Status: DC | PRN

## 2019-05-26 MED ORDER — CEPHALEXIN 500 MG PO CAPS: 500.0000 mg | ORAL_CAPSULE | Freq: Three times a day (TID) | ORAL | 0 refills | Status: DC

## 2019-05-26 NOTE — Telephone Encounter (Signed)
I called pt and told him I was calling as an post op check to see how he was doing. Pt states he is doing fine. I asked pt how he was doing on the pain medication and he stated he had an episode of nausea and took a nausea pill. I told pt he should take the nausea pill about 15-30 minutes prior to the pain medication and then take the pain medication with a light non-greasy, non-acidy food. I told pt that if he could tolerate OTC ibuprofen he could take 800mg  3 times a day to help prolong the pain medication effects. Pt states understanding.

## 2019-05-26 NOTE — Telephone Encounter (Signed)
Pt's wife, Beverlee Nims called states pt doesn't know she is calling but he had a spell of nausea and the oxycodone may have been too strong, and he took a nausea medication at about 12:15pm.

## 2019-05-26 NOTE — Progress Notes (Signed)
Postop medications sent 

## 2019-05-26 NOTE — Telephone Encounter (Signed)
I left message on pt's wife, Diana's phone informing that pt should take the nausea medication about 15-30 minute before the pain medication and take the pain medication with a light non-greasy snack like toast, and that I would contact pt and pose as a post op check.

## 2019-05-27 ENCOUNTER — Encounter: Payer: Medicare PPO | Admitting: Podiatry

## 2019-05-27 ENCOUNTER — Telehealth: Payer: Self-pay | Admitting: *Deleted

## 2019-05-27 NOTE — Telephone Encounter (Signed)
Called and spoke with the patient and the patient stated that he was doing better today with the nausea medicine and that the pain on the side of the foot was a about a 4 on the pain scale and was icing and elevating and the surgery itself went smooth and I stated to the patient to call with any concerns or questions and that we would see the patient next week for the post op visit. Todd Parks

## 2019-06-03 ENCOUNTER — Other Ambulatory Visit: Payer: Self-pay

## 2019-06-03 ENCOUNTER — Encounter: Payer: Self-pay | Admitting: Podiatry

## 2019-06-03 ENCOUNTER — Ambulatory Visit (INDEPENDENT_AMBULATORY_CARE_PROVIDER_SITE_OTHER): Payer: Medicare PPO

## 2019-06-03 ENCOUNTER — Ambulatory Visit (INDEPENDENT_AMBULATORY_CARE_PROVIDER_SITE_OTHER): Payer: Medicare PPO | Admitting: Podiatry

## 2019-06-03 DIAGNOSIS — E119 Type 2 diabetes mellitus without complications: Secondary | ICD-10-CM

## 2019-06-03 DIAGNOSIS — Z9889 Other specified postprocedural states: Secondary | ICD-10-CM

## 2019-06-03 DIAGNOSIS — M19072 Primary osteoarthritis, left ankle and foot: Secondary | ICD-10-CM | POA: Diagnosis not present

## 2019-06-06 NOTE — Progress Notes (Signed)
Subjective: Todd Parks is a 68 y.o. is seen today in office s/p left STJ fusion preformed on 05/26/2019.  He states he is doing well he is not taking any pain medication currently.  He has been taking extra strength Tylenol as needed.  Is been try to keep the foot iced and elevated he has not been putting weight on his foot denies any recent injury or falls.  Denies any systemic complaints such as fevers, chills, nausea, vomiting. No calf pain, chest pain, shortness of breath.   Objective: General: No acute distress, AAOx3  DP/PT pulses palpable 2/4, CRT < 3 sec to all digits.  Protective sensation intact. Motor function intact.  LEFT foot: Incision is well coapted without any evidence of dehiscence with sutures, staples intact. There is no surrounding erythema, ascending cellulitis, fluctuance, crepitus, malodor, drainage/purulence. There is mild edema around the surgical site. There is minimal pain along the surgical site.  Foot is in rectus position No other areas of tenderness to bilateral lower extremities.  No other open lesions or pre-ulcerative lesions.  No pain with calf compression, swelling, warmth, erythema.   Assessment and Plan:  Status post left foot subtalar joint fusion, doing well with no complications   -Treatment options discussed including all alternatives, risks, and complications -X-rays obtained reviewed.  Hardware intact status post subtalar joint fusion.  No evidence of acute fracture. -Antibiotic ointment and a dressing applied.  Keep the dressing clean, dry, intact. -Swelling has come down.  However given the neuropathy as well still some swelling will hold off on a cast to prevent any further issues but I want him to remain in the cam boot at all times and treat this like a cast and do not remove this except ice when he is awake. -Nonweightbearing -Ice/elevation -Pain medication as needed. -Monitor for any clinical signs or symptoms of infection and DVT/PE and  directed to call the office immediately should any occur or go to the ER. -Follow-up as scheduled for possible suture removal and cast application or sooner if any problems arise. In the meantime, encouraged to call the office with any questions, concerns, change in symptoms.   Celesta Gentile, DPM

## 2019-06-08 ENCOUNTER — Encounter: Payer: Medicare PPO | Admitting: Podiatry

## 2019-06-17 ENCOUNTER — Other Ambulatory Visit: Payer: Self-pay

## 2019-06-17 ENCOUNTER — Ambulatory Visit (INDEPENDENT_AMBULATORY_CARE_PROVIDER_SITE_OTHER): Payer: Medicare PPO | Admitting: Podiatry

## 2019-06-17 DIAGNOSIS — Z9889 Other specified postprocedural states: Secondary | ICD-10-CM | POA: Diagnosis not present

## 2019-06-17 DIAGNOSIS — M19072 Primary osteoarthritis, left ankle and foot: Secondary | ICD-10-CM

## 2019-06-17 DIAGNOSIS — E119 Type 2 diabetes mellitus without complications: Secondary | ICD-10-CM

## 2019-06-17 NOTE — Progress Notes (Signed)
Subjective: Todd Parks is a 68 y.o. is seen today in office s/p left STJ fusion preformed on 05/26/2019.  He states he has been doing well and is been feeling great.  Took pain medicine only for 1 day after surgery and since then only takes Tylenol as needed.  He has been in the cam boot has been icing and elevating.  Denies any injury or falls.  Denies any fevers, chills, nausea, vomiting.  No calf pain, chest pain, shortness of breath and he has no new concerns otherwise.    Objective: General: No acute distress, AAOx3  DP/PT pulses palpable 2/4, CRT < 3 sec to all digits.  Protective sensation intact. Motor function intact.  LEFT foot: Incision is well coapted without any evidence of dehiscence with sutures, staples intact. There is no surrounding erythema, ascending cellulitis, fluctuance, crepitus, malodor, drainage/purulence. There is proved edema around the surgical site. There is no significant pain along the surgical site.  Foot is in rectus position No other areas of tenderness to bilateral lower extremities.  No other open lesions or pre-ulcerative lesions.  No pain with calf compression, swelling, warmth, erythema.   Assessment and Plan:  Status post left foot subtalar joint fusion, doing well with no complications   -Treatment options discussed including all alternatives, risks, and complications -Sutures removed today without complications.  I left the staples intact.  Antibiotic ointment and a dressing applied.  A well-padded below-knee fiberglass cast was applied making sure to pad all bony prominences. -Continue nonweightbearing -Ice/elevation -Pain medication as needed. -Monitor for any clinical signs or symptoms of infection and DVT/PE and directed to call the office immediately should any occur or go to the ER. -Follow-up as scheduled for possible suture removal and cast application or sooner if any problems arise. In the meantime, encouraged to call the office with any  questions, concerns, change in symptoms.   Celesta Gentile, DPM

## 2019-06-22 ENCOUNTER — Encounter: Payer: Medicare PPO | Admitting: Podiatry

## 2019-06-28 ENCOUNTER — Other Ambulatory Visit: Payer: Self-pay

## 2019-06-28 ENCOUNTER — Ambulatory Visit (INDEPENDENT_AMBULATORY_CARE_PROVIDER_SITE_OTHER): Payer: Medicare PPO | Admitting: Podiatry

## 2019-06-28 ENCOUNTER — Encounter: Payer: Self-pay | Admitting: Podiatry

## 2019-06-28 DIAGNOSIS — E119 Type 2 diabetes mellitus without complications: Secondary | ICD-10-CM

## 2019-06-28 DIAGNOSIS — M19072 Primary osteoarthritis, left ankle and foot: Secondary | ICD-10-CM

## 2019-06-28 DIAGNOSIS — Z9889 Other specified postprocedural states: Secondary | ICD-10-CM

## 2019-07-02 NOTE — Progress Notes (Signed)
Subjective: Todd Parks is a 69 y.o. is seen today in office s/p left STJ fusion preformed on 05/26/2019.  He presents today for staple removal.  He states he is doing well with any discomfort he feels that the surgery has been a success so far.  He has no concerns otherwise.  Denies any fevers, chills, nausea, vomiting.  No calf pain, chest pain, shortness of breath.    Objective: General: No acute distress, AAOx3  DP/PT pulses palpable 2/4, CRT < 3 sec to all digits.  Protective sensation intact. Motor function intact.  LEFT foot: Incision is well coapted without any evidence of dehiscence with staples intact. There is no surrounding erythema, ascending cellulitis, fluctuance, crepitus, malodor, drainage/purulence. There is mild edema around the surgical site. There is no pain along the surgical site.  Foot is in rectus position No other areas of tenderness to bilateral lower extremities.  No other open lesions or pre-ulcerative lesions.  No pain with calf compression, swelling, warmth, erythema.   Assessment and Plan:  Status post left foot subtalar joint fusion, doing well with no complications   -Treatment options discussed including all alternatives, risks, and complications -Cast was removed today.  The staples were removed and incision was healing well.  Antibiotic ointment was applied followed by dry sterile dressing.  A well-padded below-knee fiberglass cast was applied making sure to pad all bony prominences.  Continue nonweightbearing ice elevation.  Return in about 2 weeks (around 07/12/2019).  Repeat x-rays next appointment likely transition to cam boot   Vivi Barrack DPM

## 2019-07-12 ENCOUNTER — Other Ambulatory Visit: Payer: Self-pay

## 2019-07-12 ENCOUNTER — Encounter: Payer: Self-pay | Admitting: Podiatry

## 2019-07-12 ENCOUNTER — Ambulatory Visit (INDEPENDENT_AMBULATORY_CARE_PROVIDER_SITE_OTHER): Payer: Medicare PPO

## 2019-07-12 ENCOUNTER — Ambulatory Visit (INDEPENDENT_AMBULATORY_CARE_PROVIDER_SITE_OTHER): Payer: Medicare PPO | Admitting: Podiatry

## 2019-07-12 DIAGNOSIS — E119 Type 2 diabetes mellitus without complications: Secondary | ICD-10-CM

## 2019-07-12 DIAGNOSIS — M19072 Primary osteoarthritis, left ankle and foot: Secondary | ICD-10-CM

## 2019-07-12 DIAGNOSIS — Z9889 Other specified postprocedural states: Secondary | ICD-10-CM

## 2019-07-12 NOTE — Progress Notes (Signed)
Subjective: Todd Parks is a 69 y.o. is seen today in office s/p left STJ fusion preformed on 05/26/2019.  States that he is doing well when he is ready get out of the cast.  He feels that he is doing really well and feels that he can start walking.  Denies any recent injury or falls.  He has no other concerns today. Denies any fevers, chills, nausea, vomiting.  No calf pain, chest pain, shortness of breath.    Objective: General: No acute distress, AAOx3  DP/PT pulses palpable 2/4, CRT < 3 sec to all digits.  LEFT foot: Cast is clean, dry, intact.  Incision is well coapted without any evidence and scabs overlying the incision.  There is dry, flaky skin present.  No open lesions.  Minimal swelling.  There is no erythema or warmth.  No other areas of tenderness to bilateral lower extremities.  No other open lesions or pre-ulcerative lesions.  No pain with calf compression, swelling, warmth, erythema.   Assessment and Plan:  Status post left foot subtalar joint fusion, doing well with no complications   -Treatment options discussed including all alternatives, risks, and complications -X-rays obtained reviewed.  Hardware intact.  There is increased consolidation across the arthrodesis site.  There is no evidence of acute fracture. -He was placed into a cam boot today.  Discussed gentle range of motion exercises.  Continue ice elevate and continue nonweightbearing. -Monitor for any clinical signs or symptoms of infection and directed to call the office immediately should any occur or go to the ER.  Follow-up in about 2 weeks for repeat x-rays and if doing well transition to partial weightbearing.  Vivi Barrack DPM

## 2019-07-26 ENCOUNTER — Ambulatory Visit (INDEPENDENT_AMBULATORY_CARE_PROVIDER_SITE_OTHER): Payer: Medicare PPO | Admitting: Podiatry

## 2019-07-26 ENCOUNTER — Encounter: Payer: Self-pay | Admitting: Podiatry

## 2019-07-26 ENCOUNTER — Ambulatory Visit (INDEPENDENT_AMBULATORY_CARE_PROVIDER_SITE_OTHER): Payer: Medicare PPO

## 2019-07-26 ENCOUNTER — Other Ambulatory Visit: Payer: Self-pay

## 2019-07-26 DIAGNOSIS — E119 Type 2 diabetes mellitus without complications: Secondary | ICD-10-CM

## 2019-07-26 DIAGNOSIS — M19072 Primary osteoarthritis, left ankle and foot: Secondary | ICD-10-CM

## 2019-07-26 DIAGNOSIS — Z9889 Other specified postprocedural states: Secondary | ICD-10-CM

## 2019-07-28 NOTE — Progress Notes (Signed)
Subjective: Todd Parks is a 69 y.o. is seen today in office s/p left STJ fusion preformed on 05/26/2019.  He states that he is doing wonderful.  He is having no pain.  He does admit that he has gone without his boot for short time as well as been walking in the cam boot at times.  He has no discomfort with either of these activities.  No increase in swelling.  There is no other issues today.  Denies any fevers, chills, nausea, vomiting.  No calf pain, chest pain, shortness of breath.    Objective: General: No acute distress, AAOx3  DP/PT pulses palpable 2/4, CRT < 3 sec to all digits.  LEFT foot: Incision is well coapted without any evidence there is 1 small area of a scab that is loose and coming off.  I removed this today underlying wound appears to be healed.  There is no edema, erythema, drainage or pus or any signs of infection today.  There is no tenderness to palpation at surgical sites.  Subtalar joint arthrodesis site appears to be stable.  No other open lesions or pre-ulcerative lesions.  No pain with calf compression, swelling, warmth, erythema.   Assessment and Plan:  Status post left foot subtalar joint fusion, doing well with no complications   -Treatment options discussed including all alternatives, risks, and complications -X-rays obtained reviewed.  Hardware intact.  There is increased consolidation across the arthrodesis site.  There is no evidence of acute fracture. -He is doing better.  I will continue the cam boot and wear this at all times except while sleeping.  Discussed starting partial weightbearing we will start physical therapy.  Prescription for [facility] was written. -Monitor for any clinical signs or symptoms of infection and directed to call the office immediately should any occur or go to the ER.  Follow-up in about 3-4 weeks  Vivi Barrack DPM

## 2019-08-13 ENCOUNTER — Encounter: Payer: Self-pay | Admitting: Podiatry

## 2019-08-13 ENCOUNTER — Other Ambulatory Visit: Payer: Self-pay

## 2019-08-13 ENCOUNTER — Ambulatory Visit (INDEPENDENT_AMBULATORY_CARE_PROVIDER_SITE_OTHER): Payer: Medicare PPO | Admitting: Podiatry

## 2019-08-13 VITALS — Temp 97.6°F

## 2019-08-13 DIAGNOSIS — E119 Type 2 diabetes mellitus without complications: Secondary | ICD-10-CM | POA: Diagnosis not present

## 2019-08-13 DIAGNOSIS — M19072 Primary osteoarthritis, left ankle and foot: Secondary | ICD-10-CM | POA: Diagnosis not present

## 2019-08-13 DIAGNOSIS — B351 Tinea unguium: Secondary | ICD-10-CM

## 2019-08-13 DIAGNOSIS — M79674 Pain in right toe(s): Secondary | ICD-10-CM | POA: Diagnosis not present

## 2019-08-13 DIAGNOSIS — M779 Enthesopathy, unspecified: Secondary | ICD-10-CM | POA: Diagnosis not present

## 2019-08-13 DIAGNOSIS — M79675 Pain in left toe(s): Secondary | ICD-10-CM

## 2019-08-13 NOTE — Progress Notes (Signed)
Subjective: Todd Parks is a 69 y.o. is seen today in office s/p left STJ fusion preformed on 05/26/2019. He presents today walking in a CAM boot and using a cane. He has been doing physical therapy which has been helpful. He has had minimal discomfort he reports. His pain level at max is 2/10. Denies any fevers, chills, nausea, vomiting.  No calf pain, chest pain, shortness of breath.    Objective: General: No acute distress, AAOx3  DP/PT pulses palpable 2/4, CRT < 3 sec to all digits.  LEFT foot: Incision is well coapted without any evidence and a scar has formed. There is no surrounding erythema, ascending cellulitis. There is no obvious signs of infection. Minimal edema. There is no discomfort to the surgical site. Minimal discomfort to the ankle.  Nails are hypertrophic, dystrophic, brittle, discolored, elongated 10. No surrounding redness or drainage. Tenderness nails 1-5 bilaterally. No open lesions or pre-ulcerative lesions are identified today. No other open lesions or pre-ulcerative lesions.  No pain with calf compression, swelling, warmth, erythema.   Assessment and Plan:  Status post left foot subtalar joint fusion; symptomatic onychomycosis   -Treatment options discussed including all alternatives, risks, and complications -From a surgical standpoint he is doing well.  Continue physical therapy twice a week.  As he progresses with therapy we can start to transition to a regular shoe with a Tri-Lock ankle brace which was dispensed today. Continue to ice. Compression.  -Nails debrided x10 without complications or bleeding  RTC 3 weeks and will repeat x-rays next appointment.  Vivi Barrack DPM

## 2019-09-03 ENCOUNTER — Ambulatory Visit (INDEPENDENT_AMBULATORY_CARE_PROVIDER_SITE_OTHER): Payer: Medicare PPO

## 2019-09-03 ENCOUNTER — Encounter: Payer: Self-pay | Admitting: Podiatry

## 2019-09-03 ENCOUNTER — Other Ambulatory Visit: Payer: Self-pay | Admitting: Podiatry

## 2019-09-03 ENCOUNTER — Ambulatory Visit (HOSPITAL_BASED_OUTPATIENT_CLINIC_OR_DEPARTMENT_OTHER): Payer: Medicare PPO

## 2019-09-03 ENCOUNTER — Encounter (HOSPITAL_BASED_OUTPATIENT_CLINIC_OR_DEPARTMENT_OTHER): Payer: Self-pay

## 2019-09-03 ENCOUNTER — Ambulatory Visit (INDEPENDENT_AMBULATORY_CARE_PROVIDER_SITE_OTHER): Payer: Medicare PPO | Admitting: Podiatry

## 2019-09-03 ENCOUNTER — Other Ambulatory Visit: Payer: Self-pay

## 2019-09-03 VITALS — Temp 98.2°F

## 2019-09-03 DIAGNOSIS — M19072 Primary osteoarthritis, left ankle and foot: Secondary | ICD-10-CM

## 2019-09-03 DIAGNOSIS — R234 Changes in skin texture: Secondary | ICD-10-CM

## 2019-09-03 DIAGNOSIS — E119 Type 2 diabetes mellitus without complications: Secondary | ICD-10-CM

## 2019-09-03 NOTE — Progress Notes (Signed)
Subjective: Todd Parks is a 69 y.o. is seen today in office s/p left STJ fusion preformed on 05/26/2019. He presents today walking in a regular shoe and wearing the trilock. He did not wear the brace today. He currently has no pain.  He only wears the brace if he is doing a lot of walking.  Describes some discomfort if he is on his foot a lot. He is still doing physical therapy as well but feels that he can do his exercises at home.. No recent injury or falls. No new concerns. Denies any fevers, chills, nausea, vomiting.  No calf pain, chest pain, shortness of breath.    A1c (05/18/2019) 6.4 [place] D (05/18/2019)- 39  Objective: General: No acute distress, AAOx3  DP/PT pulses palpable 2/4, CRT < 3 sec to all digits.  LEFT foot: Incision is well coapted without any evidence and a scar has formed.There is no pain along the surgical site.  No pain along the course of flexor, extensor, Achilles tendon.  All the tendons appear to be intact.  There is no significant edema there is no erythema or warmth.  Range of motion intact but any restriction except of the subtalar joint which is been fused. MMT 5/5.  Small skin fissure on the heel but this is away from the area of the incision.  There is no drainage or pus or any signs of infection. No other open lesions or pre-ulcerative lesions.  No pain with calf compression, swelling, warmth, erythema.   Assessment and Plan:  Status post left foot subtalar joint fusion; heel skin fissure  -Treatment options discussed including all alternatives, risks, and complications -X-rays were obtained and independently reviewed.  Hardware intact and increased consolidation across the arthrodesis site.  Awaiting radiology review. -At this point he is doing well and is having no pain or swelling.  He is wearing a regular shoe.  Strength is adequate.  I will discharge him with a postoperative care and increase this.  I do want him to go to physical therapy 1 more time to get  him on a good home program.  He is scheduled on Tuesday for this. -Antibiotic ointment to the skin fissure.  Monitoring signs or symptoms of infection. -I will see him back around April 30 for routine care or sooner if any issues are to arise.  Vivi Barrack DPM

## 2019-10-15 ENCOUNTER — Other Ambulatory Visit: Payer: Self-pay

## 2019-10-15 ENCOUNTER — Ambulatory Visit (INDEPENDENT_AMBULATORY_CARE_PROVIDER_SITE_OTHER): Payer: Medicare PPO | Admitting: Podiatry

## 2019-10-15 ENCOUNTER — Encounter: Payer: Self-pay | Admitting: Podiatry

## 2019-10-15 VITALS — Temp 97.4°F

## 2019-10-15 DIAGNOSIS — M79675 Pain in left toe(s): Secondary | ICD-10-CM

## 2019-10-15 DIAGNOSIS — M79674 Pain in right toe(s): Secondary | ICD-10-CM

## 2019-10-15 DIAGNOSIS — B351 Tinea unguium: Secondary | ICD-10-CM | POA: Diagnosis not present

## 2019-10-15 DIAGNOSIS — E119 Type 2 diabetes mellitus without complications: Secondary | ICD-10-CM

## 2019-10-15 NOTE — Progress Notes (Signed)
Subjective: 69 y.o. returns the office today for painful, elongated, thickened toenails which he cannot trim himself. Denies any redness or drainage around the nails. Denies any acute changes since last appointment and no new complaints today. The ankle/foot has been feeling great and not having any pain or issues. He is able to do his activities without issue. Denies any systemic complaints such as fevers, chills, nausea, vomiting.   PCP: Alain Honey, MD  A1c: 6.4  Objective: AAO 3, NAD DP/PT pulses palpable, CRT less than 3 seconds Protective sensation decreased with Simms Weinstein monofilament Nails hypertrophic, dystrophic, elongated, brittle, discolored 10. There is tenderness overlying the nails 1-5 bilaterally. There is no surrounding erythema or drainage along the nail sites. No pain on the left foot surgical site. There is minimal edema but no erythema or signs of infection. No pain to the feet otherwise.  No open lesions or pre-ulcerative lesions are identified. No other areas of tenderness bilateral lower extremities. No overlying edema, erythema, increased warmth. No pain with calf compression, swelling, warmth, erythema.  Assessment: Patient presents with symptomatic onychomycosis  Plan: -Treatment options including alternatives, risks, complications were discussed -Nails sharply debrided 10 without complication/bleeding. -Discussed daily foot inspection. If there are any changes, to call the office immediately. Continue with supportive shoes. Surgical site well healed.  -Follow-up in 3 months or sooner if any problems are to arise. In the meantime, encouraged to call the office with any questions, concerns, changes symptoms.  Ovid Curd, DPM

## 2019-11-25 ENCOUNTER — Encounter: Payer: Self-pay | Admitting: Podiatry

## 2020-01-14 ENCOUNTER — Ambulatory Visit: Payer: Medicare PPO | Admitting: Podiatry

## 2020-01-14 ENCOUNTER — Encounter: Payer: Self-pay | Admitting: Podiatry

## 2020-01-14 ENCOUNTER — Other Ambulatory Visit: Payer: Self-pay

## 2020-01-14 VITALS — Temp 97.8°F

## 2020-01-14 DIAGNOSIS — M79674 Pain in right toe(s): Secondary | ICD-10-CM

## 2020-01-14 DIAGNOSIS — B351 Tinea unguium: Secondary | ICD-10-CM | POA: Diagnosis not present

## 2020-01-14 DIAGNOSIS — M79675 Pain in left toe(s): Secondary | ICD-10-CM

## 2020-01-14 DIAGNOSIS — E119 Type 2 diabetes mellitus without complications: Secondary | ICD-10-CM

## 2020-01-14 NOTE — Progress Notes (Signed)
Subjective: 69 y.o. returns the office today for painful, elongated, thickened toenails which he cannot trim himself.  He states the ankle on the left side has been doing very well not having any pain.  Minus feet quite a bit recently and recent injury or changes otherwise.Denies any systemic complaints such as fevers, chills, nausea, vomiting.   PCP: Alain Honey, MD  A1c: 6.4  Objective: AAO 3, NAD DP/PT pulses palpable, CRT less than 3 seconds Protective sensation decreased with Simms Weinstein monofilament Nails hypertrophic, dystrophic, elongated, brittle, discolored 10. There is tenderness overlying the nails 1-5 bilaterally. There is no surrounding erythema or drainage along the nail sites. No pain on the left foot surgical site.  No open lesions or pre-ulcerative lesions are identified. No other areas of tenderness bilateral lower extremities. No overlying edema, erythema, increased warmth. No pain with calf compression, swelling, warmth, erythema.  Assessment: Patient presents with symptomatic onychomycosis  Plan: -Treatment options including alternatives, risks, complications were discussed -Nails sharply debrided 10 without complication/bleeding. -Discussed daily foot inspection. If there are any changes, to call the office immediately. Continue with supportive shoes. Surgical site well healed.  -Follow-up in 3 months or sooner if any problems are to arise. In the meantime, encouraged to call the office with any questions, concerns, changes symptoms.  Ovid Curd, DPM

## 2020-02-25 ENCOUNTER — Telehealth: Payer: Self-pay | Admitting: General Practice

## 2020-02-25 NOTE — Telephone Encounter (Signed)
That's fine. Please schedule no earlier than October. Ty.

## 2020-02-25 NOTE — Telephone Encounter (Signed)
CallerXayden Linsey Call Back # [phone]  Patient states his son sees you as PCP, patient states he would like to establish care with you.  Please Advise

## 2020-03-20 ENCOUNTER — Encounter: Payer: Self-pay | Admitting: Family Medicine

## 2020-03-20 ENCOUNTER — Other Ambulatory Visit: Payer: Self-pay

## 2020-03-20 ENCOUNTER — Ambulatory Visit (INDEPENDENT_AMBULATORY_CARE_PROVIDER_SITE_OTHER): Payer: Medicare PPO | Admitting: Family Medicine

## 2020-03-20 VITALS — BP 110/68 | HR 57 | Temp 98.5°F | Ht 68.0 in | Wt 198.5 lb

## 2020-03-20 DIAGNOSIS — I10 Essential (primary) hypertension: Secondary | ICD-10-CM | POA: Diagnosis not present

## 2020-03-20 DIAGNOSIS — E1169 Type 2 diabetes mellitus with other specified complication: Secondary | ICD-10-CM | POA: Diagnosis not present

## 2020-03-20 DIAGNOSIS — E669 Obesity, unspecified: Secondary | ICD-10-CM | POA: Diagnosis not present

## 2020-03-20 DIAGNOSIS — Z23 Encounter for immunization: Secondary | ICD-10-CM

## 2020-03-20 DIAGNOSIS — I25118 Atherosclerotic heart disease of native coronary artery with other forms of angina pectoris: Secondary | ICD-10-CM | POA: Diagnosis not present

## 2020-03-20 NOTE — Patient Instructions (Addendum)
I don't think you need to routinely check your sugars or blood pressure unless you want.  Give [place] 2-3 business days to get the results of your labs back.   Keep the diet clean and stay active.  Call your pharmacy for the second Shingrix vaccine. This is your final one.   I do recommend you get the COVID-19 vaccination, either Pfizer or Moderna.   Let us know if you need anything.

## 2020-03-20 NOTE — Progress Notes (Signed)
Chief Complaint  Patient presents with  . New Patient (Initial Visit)       New Patient Visit SUBJECTIVE: HPI: Todd Parks is an 69 y.o.male who is being seen for establishing care.  The patient was previously seen at [facility].  DM II Last a1c 6.4. Does not routinely check sugars. No routine hypoglycemia. Takes levemir 35 u daily, Invokana 300 mg/d, Metformin 1000 mg bid. Crestor 5 mg/d. UTD w eye exam, PCV23, foot exam, microalbumin/cr ratio.   Hypertension Patient presents for hypertension follow up. He does monitor home blood pressures. He is compliant with medications- Toprol XL 100 mg/d, valsartan 160 mg/d . Patient has these side effects of medication: none He is not adhering to a healthy diet overall. 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 He is not having CP or SOB. Reports compliance w meds.   Past Medical History:  Diagnosis Date  . Diabetes mellitus without complication ([place])   . GERD (gastroesophageal reflux disease)   . History of heart attack 2006  . Hyperlipidemia   . Hypertension    Past Surgical History:  Procedure Laterality Date  . BREAST SURGERY     mass  . CARPAL TUNNEL RELEASE Bilateral   . ROTATOR CUFF REPAIR Bilateral   . VASECTOMY     History reviewed. No pertinent family history. No Known Allergies  Current Outpatient Medications:  .  ACCU-CHEK AVIVA PLUS test strip, , Disp: , Rfl:  .  ACCU-CHEK SOFTCLIX LANCETS lancets, , Disp: , Rfl:  .  aspirin EC 81 MG tablet, Take by mouth., Disp: , Rfl:  .  Blood Glucose Monitoring Suppl (FIFTY50 GLUCOSE METER 2.0) w/Device KIT, Use as instructed, Disp: , Rfl:  .  chlorpheniramine (CHLOR-TRIMETON) 4 MG tablet, TAKE 1 TABLET TWICE A DAY AS NEEDED, Disp: , Rfl:  .  Clobetasol Propionate 0.05 % shampoo, APPLY TO AFFECTED AREA EVERY DAY, Disp: , Rfl: 3 .  fenofibrate (TRICOR) 145 MG tablet, Take 145 mg by mouth daily., Disp: , Rfl: 2 .  Insulin Pen Needle (BD PEN NEEDLE NANO U/F) 32G X 4 MM  MISC, USE DAILY AS DIRECTED, Disp: , Rfl:  .  INVOKANA 300 MG TABS tablet, Take 300 mg by mouth daily., Disp: , Rfl: 1 .  LEVEMIR FLEXTOUCH 100 UNIT/ML Pen, INJECT 35 UNITS DAILY, Disp: , Rfl: 3 .  metFORMIN (GLUCOPHAGE) 1000 MG tablet, TAKE 1 TABLET (1,000 MG TOTAL) BY MOUTH 2 TIMES DAILY WITH MEALS., Disp: , Rfl: 0 .  metoprolol succinate (TOPROL-XL) 100 MG 24 hr tablet, TAKE 1 TABLET BY MOUTH EVERY DAY, Disp: , Rfl:  .  montelukast (SINGULAIR) 10 MG tablet, Take by mouth., Disp: , Rfl:  .  nitroGLYCERIN (NITROSTAT) 0.4 MG SL tablet, Place under the tongue., Disp: , Rfl:  .  NONFORMULARY OR COMPOUNDED ITEM, [facility]: Onychomycosis Nail lacquer - Fluconazole 2%, Terbinafine 1%, DMSO, apply to affected area daily., Disp: 120 each, Rfl: 2 .  omeprazole (PRILOSEC) 20 MG capsule, TAKE 1 CAPSULE BY MOUTH EVERY DAY, Disp: , Rfl:  .  rosuvastatin (CRESTOR) 5 MG tablet, Take 5 mg by mouth daily., Disp: , Rfl: 0 .  valsartan (DIOVAN) 160 MG tablet, Take 160 mg by mouth daily., Disp: , Rfl: 3  OBJECTIVE: BP 110/68 (BP Location: Left Arm, Patient Position: Sitting, Cuff Size: Normal)   Pulse (!) 57   Temp 98.5 F (36.9 C) (Oral)   Ht 5' 8"  (1.727 m)   Wt 198 lb 8  oz (90 kg)   SpO2 99%   BMI 30.18 kg/m  General:  well developed, well nourished, in no apparent distress Skin:  no significant moles, warts, or growths Nose:  nares patent, septum midline, mucosa normal Throat/Pharynx:  lips and gingiva without lesion; tongue and uvula midline; non-inflamed pharynx; no exudates or postnasal drainage Lungs:  clear to auscultation, breath sounds equal bilaterally, no respiratory distress Cardio:  regular rate and rhythm, no LE edema or bruits Musculoskeletal:  symmetrical muscle groups noted without atrophy or deformity Neuro:  gait normal Psych: well oriented with normal range of affect and appropriate judgment/insight  ASSESSMENT/PLAN: Diabetes mellitus type 2 in obese ([place]) - Plan:  Hemoglobin A1c, Comprehensive metabolic panel, Lipid panel  Essential hypertension  Coronary artery disease of native artery of native heart with stable angina pectoris ([place])  Need for influenza vaccination - Plan: Flu Vaccine QUAD High Dose(Fluad)  1. Check labs. Cont levemir 35 u/d, Invokana 300 mg/d, Metformin 1000 mg bid. Counseled on diet/exercise. 2. Controlled. Continue valsartan, Toprol as above.  3. He sees cards with [facility]. No CP. He is on beta-blocker.  Rec'd he get the covid vaccination.  Patient should return in 6 mo for CPE/AWV. The patient voiced understanding and agreement to the plan.   [place], DO 03/20/20  12:58 PM

## 2020-03-21 ENCOUNTER — Other Ambulatory Visit: Payer: Self-pay | Admitting: Family Medicine

## 2020-03-21 DIAGNOSIS — E785 Hyperlipidemia, unspecified: Secondary | ICD-10-CM

## 2020-03-21 LAB — LIPID PANEL
Cholesterol: 131 mg/dL (ref <200)
HDL: 30 mg/dL — ABNORMAL LOW (ref >=40)
LDL Cholesterol (Calc): 69 mg/dL (calc)
Non-HDL Cholesterol (Calc): 101 mg/dL (calc) (ref <130)
Total CHOL/HDL Ratio: 4.4 (calc) (ref <5.0)
Triglycerides: 231 mg/dL — ABNORMAL HIGH (ref <150)

## 2020-03-21 LAB — COMPREHENSIVE METABOLIC PANEL
AG Ratio: 1.8 (calc) (ref 1.0–2.5)
ALT: 19 U/L (ref 9–46)
AST: 22 U/L (ref 10–35)
Albumin: 4.5 g/dL (ref 3.6–5.1)
Alkaline phosphatase (APISO): 38 U/L (ref 35–144)
BUN/Creatinine Ratio: 16 (calc) (ref 6–22)
BUN: 21 mg/dL (ref 7–25)
CO2: 26 mmol/L (ref 20–32)
Calcium: 10 mg/dL (ref 8.6–10.3)
Chloride: 103 mmol/L (ref 98–110)
Creat: 1.31 mg/dL — ABNORMAL HIGH (ref 0.70–1.25)
Globulin: 2.5 g/dL (calc) (ref 1.9–3.7)
Glucose, Bld: 132 mg/dL — ABNORMAL HIGH (ref 65–99)
Potassium: 4.1 mmol/L (ref 3.5–5.3)
Sodium: 140 mmol/L (ref 135–146)
Total Bilirubin: 0.5 mg/dL (ref 0.2–1.2)
Total Protein: 7 g/dL (ref 6.1–8.1)

## 2020-03-21 LAB — HEMOGLOBIN A1C
Hgb A1c MFr Bld: 6.3 % of total Hgb — ABNORMAL HIGH (ref <5.7)
Mean Plasma Glucose: 134 (calc)
eAG (mmol/L): 7.4 (calc)

## 2020-04-21 ENCOUNTER — Other Ambulatory Visit: Payer: Self-pay

## 2020-04-21 ENCOUNTER — Ambulatory Visit: Payer: Medicare PPO | Admitting: Podiatry

## 2020-04-21 DIAGNOSIS — M79675 Pain in left toe(s): Secondary | ICD-10-CM | POA: Diagnosis not present

## 2020-04-21 DIAGNOSIS — M79674 Pain in right toe(s): Secondary | ICD-10-CM

## 2020-04-21 DIAGNOSIS — E119 Type 2 diabetes mellitus without complications: Secondary | ICD-10-CM

## 2020-04-21 DIAGNOSIS — B351 Tinea unguium: Secondary | ICD-10-CM

## 2020-04-21 NOTE — Progress Notes (Signed)
Subjective: 69 y.o. returns the office today for painful, elongated, thickened toenails which he cannot trim himself.  He states the ankle on the left side has been doing very well not having any pain.  Minus feet quite a bit recently and recent injury or changes otherwise.Denies any systemic complaints such as fevers, chills, nausea, vomiting.   PCP: Alain Honey, MD  A1c: 6.4  Objective: AAO 3, NAD DP/PT pulses palpable, CRT less than 3 seconds Protective sensation decreased with Simms Weinstein monofilament Nails hypertrophic, dystrophic, elongated, brittle, discolored 10. There is tenderness overlying the nails 1-5 bilaterally. There is no surrounding erythema or drainage along the nail sites. Minimal hyperkeratotic tissue medial hallux without any underlying ulceration, drainage or signs of infection. Dry skin but no fissures.  No pain on the left foot surgical site.  No open lesions or pre-ulcerative lesions are identified. No other areas of tenderness bilateral lower extremities. No overlying edema, erythema, increased warmth. No pain with calf compression, swelling, warmth, erythema.  Assessment: Patient presents with symptomatic onychomycosis  Plan: -Treatment options including alternatives, risks, complications were discussed -Nails sharply debrided 10 without complication/bleeding. -Discussed daily foot inspection. If there are any changes, to call the office immediately. Continue with supportive shoes. Surgical site well healed.  -Follow-up in 3 months or sooner if any problems are to arise. In the meantime, encouraged to call the office with any questions, concerns, changes symptoms.  Ovid Curd, DPM

## 2020-04-25 DIAGNOSIS — Z6830 Body mass index (BMI) 30.0-30.9, adult: Secondary | ICD-10-CM | POA: Insufficient documentation

## 2020-04-26 LAB — HM DIABETES EYE EXAM

## 2020-05-01 ENCOUNTER — Encounter: Payer: Self-pay | Admitting: *Deleted

## 2020-05-10 ENCOUNTER — Other Ambulatory Visit: Payer: Self-pay

## 2020-05-10 ENCOUNTER — Other Ambulatory Visit (INDEPENDENT_AMBULATORY_CARE_PROVIDER_SITE_OTHER): Payer: Medicare PPO

## 2020-05-10 DIAGNOSIS — E785 Hyperlipidemia, unspecified: Secondary | ICD-10-CM

## 2020-05-10 LAB — LIPID PANEL
Cholesterol: 133 mg/dL (ref 0–200)
HDL: 33.1 mg/dL — ABNORMAL LOW (ref >39.00)
LDL Cholesterol: 72 mg/dL (ref 0–99)
NonHDL: 100.11
Total CHOL/HDL Ratio: 4
Triglycerides: 143 mg/dL (ref 0.0–149.0)
VLDL: 28.6 mg/dL (ref 0.0–40.0)

## 2020-06-14 ENCOUNTER — Other Ambulatory Visit: Payer: Self-pay | Admitting: *Deleted

## 2020-06-14 MED ORDER — BD PEN NEEDLE NANO U/F 32G X 4 MM MISC
1 refills | Status: DC
Start: 2020-06-14 — End: 2020-10-26

## 2020-07-10 ENCOUNTER — Telehealth (INDEPENDENT_AMBULATORY_CARE_PROVIDER_SITE_OTHER): Payer: Medicare PPO | Admitting: Family Medicine

## 2020-07-10 ENCOUNTER — Other Ambulatory Visit: Payer: Self-pay

## 2020-07-10 ENCOUNTER — Encounter: Payer: Self-pay | Admitting: Family Medicine

## 2020-07-10 DIAGNOSIS — J069 Acute upper respiratory infection, unspecified: Secondary | ICD-10-CM | POA: Diagnosis not present

## 2020-07-10 DIAGNOSIS — R197 Diarrhea, unspecified: Secondary | ICD-10-CM

## 2020-07-10 MED ORDER — BENZONATATE 100 MG PO CAPS: 100.0000 mg | ORAL_CAPSULE | Freq: Three times a day (TID) | ORAL | 0 refills | Status: DC | PRN

## 2020-07-10 MED ORDER — PROMETHAZINE-DM 6.25-15 MG/5ML PO SYRP: 5.0000 mL | ORAL_SOLUTION | Freq: Four times a day (QID) | ORAL | 0 refills | Status: DC | PRN

## 2020-07-10 NOTE — Progress Notes (Addendum)
Chief Complaint  Patient presents with  . Sinusitis    Todd Parks here for URI complaints. Due to COVID-19 pandemic, we are interacting via telephone. I verified patient's ID using 2 identifiers. Patient agreed to proceed with visit via this method. Patient is at home, I am at office. Patient and I are present for visit.   Duration: 3 days   Associated symptoms: sinus headache, sinus congestion, rhinorrhea, sore throat and diarrhea, coughing Denies: sinus pain, itchy watery eyes, ear pain, ear drainage, wheezing, shortness of breath, myalgia and fevers, N/V Treatment to date: Singulair, chlorpheniramine Sick contacts: Yes- DIL tested positive for covid; wife ill also He is not vaccinated.  Wife and him were tested this AM, results not back yet.   Past Medical History:  Diagnosis Date  . Diabetes mellitus without complication ([facility])   . GERD (gastroesophageal reflux disease)   . History of heart attack 2006  . Hyperlipidemia   . Hypertension    Objective No conversational dyspnea Age appropriate judgment and insight Nml affect and mood  Viral URI with cough - Plan: benzonatate (TESSALON) 100 MG capsule, promethazine-dextromethorphan (PROMETHAZINE-DM) 6.25-15 MG/5ML syrup  Diarrhea, unspecified type  I think he has covid, will await test results. Cover s/s's as they are improving w above cough suppressants. Refer for MAb if +.  Continue to push fluids, practice good hand hygiene, cover mouth when coughing. F/u prn. If starting to experience fevers, shaking, or shortness of breath, seek immediate care. Total time: 11 min Pt voiced understanding and agreement to the plan.  Jilda Roche [place], DO 07/10/20 2:59 PM

## 2020-07-11 ENCOUNTER — Other Ambulatory Visit: Payer: Self-pay | Admitting: Family Medicine

## 2020-07-11 DIAGNOSIS — U071 COVID-19: Secondary | ICD-10-CM

## 2020-07-12 ENCOUNTER — Telehealth: Payer: Self-pay | Admitting: Family

## 2020-07-12 ENCOUNTER — Ambulatory Visit (HOSPITAL_COMMUNITY)
Admission: RE | Admit: 2020-07-12 | Discharge: 2020-07-12 | Disposition: A | Discharge status: Home / Self Care | Payer: Medicare PPO | Source: Ambulatory Visit | Attending: Pulmonary Disease | Admitting: Pulmonary Disease

## 2020-07-12 ENCOUNTER — Other Ambulatory Visit: Payer: Self-pay | Admitting: Family

## 2020-07-12 DIAGNOSIS — I10 Essential (primary) hypertension: Secondary | ICD-10-CM | POA: Diagnosis not present

## 2020-07-12 DIAGNOSIS — E119 Type 2 diabetes mellitus without complications: Secondary | ICD-10-CM | POA: Insufficient documentation

## 2020-07-12 DIAGNOSIS — R54 Age-related physical debility: Secondary | ICD-10-CM | POA: Diagnosis not present

## 2020-07-12 DIAGNOSIS — U071 COVID-19: Secondary | ICD-10-CM

## 2020-07-12 MED ORDER — SODIUM CHLORIDE 0.9 % IV SOLN: INTRAVENOUS | Status: DC | PRN

## 2020-07-12 MED ORDER — DIPHENHYDRAMINE HCL 50 MG/ML IJ SOLN: 50.0000 mg | Freq: Once | INTRAMUSCULAR | Status: DC | PRN

## 2020-07-12 MED ORDER — EPINEPHRINE 0.3 MG/0.3ML IJ SOAJ: 0.3000 mg | Freq: Once | INTRAMUSCULAR | Status: DC | PRN

## 2020-07-12 MED ORDER — ALBUTEROL SULFATE HFA 108 (90 BASE) MCG/ACT IN AERS: 2.0000 | INHALATION_SPRAY | Freq: Once | RESPIRATORY_TRACT | Status: DC | PRN

## 2020-07-12 MED ORDER — METHYLPREDNISOLONE SODIUM SUCC 125 MG IJ SOLR: 125.0000 mg | Freq: Once | INTRAMUSCULAR | Status: DC | PRN

## 2020-07-12 MED ORDER — SODIUM CHLORIDE 0.9 % IV SOLN
500.0000 mg | Freq: Once | INTRAVENOUS | Status: AC
  Administered 2020-07-12: 500 mg via INTRAVENOUS

## 2020-07-12 MED ORDER — FAMOTIDINE IN NACL 20-0.9 MG/50ML-% IV SOLN: 20.0000 mg | Freq: Once | INTRAVENOUS | Status: DC | PRN

## 2020-07-12 NOTE — Progress Notes (Signed)
Patient reviewed Fact Sheet for Patients, Parents, and Caregivers for Emergency Use Authorization (EUA) of sotrovimab for the Treatment of Coronavirus. Patient also reviewed and is agreeable to the estimated cost of treatment. Patient is agreeable to proceed.   

## 2020-07-12 NOTE — Progress Notes (Signed)
Diagnosis: COVID-19  Physician: Dr. Patrick Wright  Procedure: [facility] Infusion [facility] Med: Sotrovimab infusion - Provided patient with sotrovimab fact sheet for patients, parents, and caregivers prior to infusion.   Complications: No immediate complications noted  Discharge: Discharged home    

## 2020-07-12 NOTE — Telephone Encounter (Signed)
Symptom onset 07/08/20 Test positive 07/10/20 at [facility] in [place].   I connected by phone with Garry Heater on 07/12/2020 at 1:25 PM to discuss the potential use of a new treatment for mild to moderate COVID-19 viral infection in non-hospitalized patients.  This patient is a 70 y.o. male that meets the FDA criteria for Emergency Use Authorization of COVID monoclonal antibody sotrovimab.  Has a (+) direct SARS-CoV-2 viral test result  Has mild or moderate COVID-19   Is NOT hospitalized due to COVID-19  Is within 10 days of symptom onset  Has at least one of the high risk factor(s) for progression to severe COVID-19 and/or hospitalization as defined in EUA.  Specific high risk criteria : Older age (>/= 70 yo), Diabetes and Cardiovascular disease or hypertension   I have spoken and communicated the following to the patient or parent/caregiver regarding COVID monoclonal antibody treatment:  1. FDA has authorized the emergency use for the treatment of mild to moderate COVID-19 in adults and pediatric patients with positive results of direct SARS-CoV-2 viral testing who are 66 years of age and older weighing at least 40 kg, and who are at high risk for progressing to severe COVID-19 and/or hospitalization.  2. The significant known and potential risks and benefits of COVID monoclonal antibody, and the extent to which such potential risks and benefits are unknown.  3. Information on available alternative treatments and the risks and benefits of those alternatives, including clinical trials.  4. Patients treated with COVID monoclonal antibody should continue to self-isolate and use infection control measures (e.g., wear mask, isolate, social distance, avoid sharing personal items, clean and disinfect "high touch" surfaces, and frequent handwashing) according to CDC guidelines.   5. The patient or parent/caregiver has the option to accept or refuse COVID monoclonal antibody  treatment.  After reviewing this information with the patient, the patient has agreed to receive one of the available covid 19 monoclonal antibodies and will be provided an appropriate fact sheet prior to infusion. Alver Sorrow, NP 07/12/2020 1:25 PM

## 2020-07-12 NOTE — Discharge Instructions (Signed)

## 2020-07-28 ENCOUNTER — Other Ambulatory Visit: Payer: Self-pay

## 2020-07-28 ENCOUNTER — Ambulatory Visit: Payer: Medicare PPO | Admitting: Podiatry

## 2020-07-28 DIAGNOSIS — E119 Type 2 diabetes mellitus without complications: Secondary | ICD-10-CM

## 2020-07-28 DIAGNOSIS — B351 Tinea unguium: Secondary | ICD-10-CM

## 2020-07-28 DIAGNOSIS — M79674 Pain in right toe(s): Secondary | ICD-10-CM | POA: Diagnosis not present

## 2020-07-28 DIAGNOSIS — M79675 Pain in left toe(s): Secondary | ICD-10-CM | POA: Diagnosis not present

## 2020-07-28 NOTE — Progress Notes (Signed)
Subjective: 70 y.o. returns the office today for painful, elongated, thickened toenails which he cannot trim himself.  He states the ankle on the left side has been doing very well not having any pain.  Minus feet quite a bit recently and recent injury or changes otherwise.Denies any systemic complaints such as fevers, chills, nausea, vomiting.   Sharlene Dory, DO  A1c: 6.3 on 03/20/2020  COVID+ last month, had monoclonal antibody treatment and did well.   Objective: AAO 3, NAD DP/PT pulses palpable, CRT less than 3 seconds Protective sensation decreased with Simms Weinstein monofilament Nails hypertrophic, dystrophic, elongated, brittle, discolored 10. There is tenderness overlying the nails 1-5 bilaterally. There is no surrounding erythema or drainage along the nail sites. Dry skin is present without any open lesions, fissures. No pain with calf compression, swelling, warmth, erythema.  Assessment: Patient presents with symptomatic onychomycosis  Plan: -Treatment options including alternatives, risks, complications were discussed -Nails sharply debrided 10 without complication/bleeding. -Discussed daily foot inspection. If there are any changes, to call the office immediately. Continue with supportive shoes. Surgical site well healed.  -Follow-up in 3 months or sooner if any problems are to arise. In the meantime, encouraged to call the office with any questions, concerns, changes symptoms.  Ovid Curd, DPM

## 2020-09-14 ENCOUNTER — Other Ambulatory Visit: Payer: Self-pay | Admitting: Family Medicine

## 2020-09-14 DIAGNOSIS — E119 Type 2 diabetes mellitus without complications: Secondary | ICD-10-CM

## 2020-09-19 ENCOUNTER — Encounter: Payer: Self-pay | Admitting: Family Medicine

## 2020-09-19 ENCOUNTER — Ambulatory Visit (INDEPENDENT_AMBULATORY_CARE_PROVIDER_SITE_OTHER): Payer: Medicare PPO | Admitting: Family Medicine

## 2020-09-19 ENCOUNTER — Other Ambulatory Visit: Payer: Self-pay

## 2020-09-19 ENCOUNTER — Other Ambulatory Visit: Payer: Self-pay | Admitting: Family Medicine

## 2020-09-19 VITALS — BP 108/62 | HR 72 | Temp 97.9°F | Ht 68.0 in | Wt 202.1 lb

## 2020-09-19 DIAGNOSIS — Z1159 Encounter for screening for other viral diseases: Secondary | ICD-10-CM | POA: Diagnosis not present

## 2020-09-19 DIAGNOSIS — E669 Obesity, unspecified: Secondary | ICD-10-CM

## 2020-09-19 DIAGNOSIS — Z0000 Encounter for general adult medical examination without abnormal findings: Secondary | ICD-10-CM | POA: Diagnosis not present

## 2020-09-19 DIAGNOSIS — E785 Hyperlipidemia, unspecified: Secondary | ICD-10-CM

## 2020-09-19 DIAGNOSIS — E1169 Type 2 diabetes mellitus with other specified complication: Secondary | ICD-10-CM | POA: Diagnosis not present

## 2020-09-19 DIAGNOSIS — I25118 Atherosclerotic heart disease of native coronary artery with other forms of angina pectoris: Secondary | ICD-10-CM | POA: Diagnosis not present

## 2020-09-19 LAB — CBC
HCT: 43.6 % (ref 39.0–52.0)
Hemoglobin: 14.8 g/dL (ref 13.0–17.0)
MCHC: 34 g/dL (ref 30.0–36.0)
MCV: 87.3 fl (ref 78.0–100.0)
Platelets: 183 10*3/uL (ref 150.0–400.0)
RBC: 5 Mil/uL (ref 4.22–5.81)
RDW: 13.9 % (ref 11.5–15.5)
WBC: 5 10*3/uL (ref 4.0–10.5)

## 2020-09-19 LAB — COMPREHENSIVE METABOLIC PANEL
ALT: 32 U/L (ref 0–53)
AST: 36 U/L (ref 0–37)
Albumin: 4.7 g/dL (ref 3.5–5.2)
Alkaline Phosphatase: 34 U/L — ABNORMAL LOW (ref 39–117)
BUN: 17 mg/dL (ref 6–23)
CO2: 30 mEq/L (ref 19–32)
Calcium: 9.7 mg/dL (ref 8.4–10.5)
Chloride: 104 mEq/L (ref 96–112)
Creatinine, Ser: 1.29 mg/dL (ref 0.40–1.50)
GFR: 56.37 mL/min — ABNORMAL LOW (ref >60.00)
Glucose, Bld: 96 mg/dL (ref 70–99)
Potassium: 4.4 mEq/L (ref 3.5–5.1)
Sodium: 141 mEq/L (ref 135–145)
Total Bilirubin: 0.7 mg/dL (ref 0.2–1.2)
Total Protein: 7.1 g/dL (ref 6.0–8.3)

## 2020-09-19 LAB — LDL CHOLESTEROL, DIRECT: Direct LDL: 95 mg/dL

## 2020-09-19 LAB — MICROALBUMIN / CREATININE URINE RATIO
Creatinine,U: 64.2 mg/dL
Microalb Creat Ratio: 1.1 mg/g (ref 0.0–30.0)
Microalb, Ur: <0.7 mg/dL (ref 0.0–1.9)

## 2020-09-19 LAB — LIPID PANEL
Cholesterol: 144 mg/dL (ref 0–200)
HDL: 29.2 mg/dL — ABNORMAL LOW (ref >39.00)
NonHDL: 114.42
Total CHOL/HDL Ratio: 5
Triglycerides: 216 mg/dL — ABNORMAL HIGH (ref 0.0–149.0)
VLDL: 43.2 mg/dL — ABNORMAL HIGH (ref 0.0–40.0)

## 2020-09-19 LAB — HEMOGLOBIN A1C: Hgb A1c MFr Bld: 6.4 % (ref 4.6–6.5)

## 2020-09-19 NOTE — Patient Instructions (Addendum)
Give us 2-3 business days to get the results of your labs back.   Keep the diet clean and stay active.  Let us know if you need anything.  Healthy Eating Plan Many factors influence your heart health, including eating and exercise habits. Heart (coronary) risk increases with abnormal blood fat (lipid) levels. Heart-healthy meal planning includes limiting unhealthy fats, increasing healthy fats, and making other small dietary changes. This includes maintaining a healthy body weight to help keep lipid levels within a normal range.  WHAT IS MY PLAN?  Your health care provider recommends that you: Drink a glass of water before meals to help with satiety. Eat slowly. An alternative to the water is to add Metamucil. This will help with satiety as well. It does contain calories, unlike water.  WHAT TYPES OF FAT SHOULD I CHOOSE? Choose healthy fats more often. Choose monounsaturated and polyunsaturated fats, such as olive oil and canola oil, flaxseeds, walnuts, almonds, and seeds. Eat more omega-3 fats. Good choices include salmon, mackerel, sardines, tuna, flaxseed oil, and ground flaxseeds. Aim to eat fish at least two times each week. Avoid foods with partially hydrogenated oils in them. These contain trans fats. Examples of foods that contain trans fats are stick margarine, some tub margarines, cookies, crackers, and other baked goods. If you are going to avoid a fat, this is the one to avoid!  WHAT GENERAL GUIDELINES DO I NEED TO FOLLOW? Check food labels carefully to identify foods with trans fats. Avoid these types of options when possible. Fill one half of your plate with vegetables and green salads. Eat 4-5 servings of vegetables per day. A serving of vegetables equals 1 cup of raw leafy vegetables,  cup of raw or cooked cut-up vegetables, or  cup of vegetable juice. Fill one fourth of your plate with whole grains. Look for the word "whole" as the first word in the ingredient list. Fill  one fourth of your plate with lean protein foods. Eat 4-5 servings of fruit per day. A serving of fruit equals one medium whole fruit,  cup of dried fruit,  cup of fresh, frozen, or canned fruit. Try to avoid fruits in cups/syrups as the sugar content can be high. Eat more foods that contain soluble fiber. Examples of foods that contain this type of fiber are apples, broccoli, carrots, beans, peas, and barley. Aim to get 20-30 g of fiber per day. Eat more home-cooked food and less restaurant, buffet, and fast food. Limit or avoid alcohol. Limit foods that are high in starch and sugar. Avoid fried foods when able. Cook foods by using methods other than frying. Baking, boiling, grilling, and broiling are all great options. Other fat-reducing suggestions include: Removing the skin from poultry. Removing all visible fats from meats. Skimming the fat off of stews, soups, and gravies before serving them. Steaming vegetables in water or broth. Lose weight if you are overweight. Losing just 5-10% of your initial body weight can help your overall health and prevent diseases such as diabetes and heart disease. Increase your consumption of nuts, legumes, and seeds to 4-5 servings per week. One serving of dried beans or legumes equals  cup after being cooked, one serving of nuts equals 1 ounces, and one serving of seeds equals  ounce or 1 tablespoon.  WHAT ARE GOOD FOODS CAN I EAT? Grains Grainy breads (try to find bread that is 3 g of fiber per slice or greater), oatmeal, light popcorn. Whole-grain cereals. Rice and pasta, including brown rice   and those that are made with whole wheat. Edamame pasta is a great alternative to grain pasta. It has a higher protein content. Try to avoid significant consumption of white bread, sugary cereals, or pastries/baked goods.  Vegetables All vegetables. Cooked white potatoes do not count as vegetables.  Fruits All fruits, but limit pineapple and bananas as these  fruits have a higher sugar content.  Meats and Other Protein Sources Lean, well-trimmed beef, veal, pork, and lamb. Chicken and turkey without skin. All fish and shellfish. Wild duck, rabbit, pheasant, and venison. Egg whites or low-cholesterol egg substitutes. Dried beans, peas, lentils, and tofu. Seeds and most nuts.  Dairy Low-fat or nonfat cheeses, including ricotta, string, and mozzarella. Skim or 1% milk that is liquid, powdered, or evaporated. Buttermilk that is made with low-fat milk. Nonfat or low-fat yogurt. Soy/Almond milk are good alternatives if you cannot handle dairy.  Beverages Water is the best for you. Sports drinks with less sugar are more desirable unless you are a highly active athlete.  Sweets and Desserts Sherbets and fruit ices. Honey, jam, marmalade, jelly, and syrups. Dark chocolate.  Eat all sweets and desserts in moderation.  Fats and Oils Nonhydrogenated (trans-free) margarines. Vegetable oils, including soybean, sesame, sunflower, olive, peanut, safflower, corn, canola, and cottonseed. Salad dressings or mayonnaise that are made with a vegetable oil. Limit added fats and oils that you use for cooking, baking, salads, and as spreads.  Other Cocoa powder. Coffee and tea. Most condiments.  The items listed above may not be a complete list of recommended foods or beverages. Contact your dietitian for more options.  

## 2020-09-19 NOTE — Progress Notes (Signed)
Chief Complaint  Patient presents with  . Annual Exam    Well Male Todd Parks is here for a complete physical.   His last physical was >1 year ago.  Current diet: in general, diet could be better.   Current exercise: no routine exercise Weight trend: stable Fatigue out of ordinary? No. Seat belt? Yes.    Health maintenance Shingrix- Yes Colonoscopy- Yes Tetanus- Yes Hep C- No Pneumonia vaccine- Yes  Past Medical History:  Diagnosis Date  . Diabetes mellitus without complication ([place])   . GERD (gastroesophageal reflux disease)   . History of heart attack 2006  . Hyperlipidemia   . Hypertension      Past Surgical History:  Procedure Laterality Date  . BREAST SURGERY     mass  . CARPAL TUNNEL RELEASE Bilateral   . ROTATOR CUFF REPAIR Bilateral   . VASECTOMY      Medications  Current Outpatient Medications on File Prior to Visit  Medication Sig Dispense Refill  . ACCU-CHEK AVIVA PLUS test strip     . ACCU-CHEK SOFTCLIX LANCETS lancets     . aspirin EC 81 MG tablet Take by mouth.    . benzonatate (TESSALON) 100 MG capsule Take 1 capsule (100 mg total) by mouth 3 (three) times daily as needed. 30 capsule 0  . Blood Glucose Monitoring Suppl (FIFTY50 GLUCOSE METER 2.0) w/Device KIT Use as instructed    . chlorpheniramine (CHLOR-TRIMETON) 4 MG tablet TAKE 1 TABLET TWICE A DAY AS NEEDED    . Cholecalciferol (VITAMIN D3) 50 MCG (2000 UT) capsule     . Clobetasol Propionate 0.05 % shampoo APPLY TO AFFECTED AREA EVERY DAY  3  . fenofibrate (TRICOR) 145 MG tablet Take 145 mg by mouth daily.  2  . Insulin Pen Needle (BD PEN NEEDLE NANO U/F) 32G X 4 MM MISC USE ONCE DAILY WITH LEVEMIR PEN 100 each 1  . INVOKANA 300 MG TABS tablet Take 300 mg by mouth daily.  1  . LEVEMIR FLEXTOUCH 100 UNIT/ML FlexPen INJECT 35 UNITS SUBCUTANEOUSLY DAILY 100 mL 3  . metFORMIN (GLUCOPHAGE) 1000 MG tablet TAKE 1 TABLET (1,000 MG TOTAL) BY MOUTH 2 TIMES DAILY WITH MEALS.  0  . metoprolol succinate  (TOPROL-XL) 100 MG 24 hr tablet TAKE 1 TABLET BY MOUTH EVERY DAY    . montelukast (SINGULAIR) 10 MG tablet Take by mouth.    . nitroGLYCERIN (NITROSTAT) 0.4 MG SL tablet Place under the tongue.    Marland Kitchen omeprazole (PRILOSEC) 20 MG capsule TAKE 1 CAPSULE BY MOUTH EVERY DAY    . promethazine-dextromethorphan (PROMETHAZINE-DM) 6.25-15 MG/5ML syrup Take 5 mLs by mouth 4 (four) times daily as needed for cough. 118 mL 0  . rosuvastatin (CRESTOR) 5 MG tablet Take 5 mg by mouth daily.  0  . valsartan (DIOVAN) 160 MG tablet Take 160 mg by mouth daily.  3    Allergies No Known Allergies  Family History Family History  Problem Relation Age of Onset  . Cancer Neg Hx     Review of Systems: Constitutional:  no fevers Eye:  no recent significant change in vision Ears:  No changes in hearing Nose/Mouth/Throat:  no complaints of nasal congestion, no sore throat Cardiovascular: no chest pain Respiratory:  No shortness of breath Gastrointestinal:  No change in bowel habits GU:  No frequency Integumentary:  no abnormal skin lesions reported Neurologic:  no headaches Endocrine:  denies unexplained weight changes  Exam BP 108/62 (BP Location: Left Arm, Patient Position: Sitting,  Cuff Size: Normal)   Pulse 72   Temp 97.9 F (36.6 C) (Oral)   Ht 5' 8"  (1.727 m)   Wt 202 lb 2 oz (91.7 kg)   SpO2 97%   BMI 30.73 kg/m  General:  well developed, well nourished, in no apparent distress Skin:  no significant moles, warts, or growths Head:  no masses, lesions, or tenderness Eyes:  pupils equal and round, sclera anicteric without injection Ears:  canals without lesions, TMs shiny without retraction, no obvious effusion, no erythema Nose:  nares patent, septum midline, mucosa normal Throat/Pharynx:  lips and gingiva without lesion; tongue and uvula midline; non-inflamed pharynx; no exudates or postnasal drainage Lungs:  clear to auscultation, breath sounds equal bilaterally, no respiratory  distress Cardio:  regular rate and rhythm, no LE edema or bruits Rectal: Deferred GI: BS+, S, NT, ND, no masses or organomegaly Musculoskeletal:  symmetrical muscle groups noted without atrophy or deformity Neuro:  gait normal; deep tendon reflexes normal and symmetric Psych: well oriented with normal range of affect and appropriate judgment/insight  Assessment and Plan  Well adult exam  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 male. Counseled on diet and exercise. Other orders as above. Follow up in 6 mo.  The patient voiced understanding and agreement to the plan.  [place], DO 09/19/20 9:41 AM

## 2020-09-20 LAB — HEPATITIS C ANTIBODY
Hepatitis C Ab: NONREACTIVE
SIGNAL TO CUT-OFF: 0 (ref <1.00)

## 2020-10-06 ENCOUNTER — Other Ambulatory Visit: Payer: Self-pay | Admitting: Family Medicine

## 2020-10-26 ENCOUNTER — Other Ambulatory Visit: Payer: Self-pay | Admitting: Family Medicine

## 2020-10-27 ENCOUNTER — Other Ambulatory Visit: Payer: Self-pay | Admitting: Neurological Surgery

## 2020-10-27 ENCOUNTER — Ambulatory Visit: Payer: Medicare PPO | Admitting: Podiatry

## 2020-10-27 ENCOUNTER — Other Ambulatory Visit: Payer: Self-pay

## 2020-10-27 ENCOUNTER — Encounter: Payer: Self-pay | Admitting: Podiatry

## 2020-10-27 DIAGNOSIS — M79675 Pain in left toe(s): Secondary | ICD-10-CM | POA: Diagnosis not present

## 2020-10-27 DIAGNOSIS — B351 Tinea unguium: Secondary | ICD-10-CM | POA: Diagnosis not present

## 2020-10-27 DIAGNOSIS — E119 Type 2 diabetes mellitus without complications: Secondary | ICD-10-CM

## 2020-10-27 DIAGNOSIS — M79674 Pain in right toe(s): Secondary | ICD-10-CM | POA: Diagnosis not present

## 2020-10-27 DIAGNOSIS — M47816 Spondylosis without myelopathy or radiculopathy, lumbar region: Secondary | ICD-10-CM

## 2020-10-27 NOTE — Progress Notes (Signed)
Subjective: 70 y.o. returns the office today for painful, elongated, thickened toenails which he cannot trim himself.  Denies any open lesions.  He does not check his blood sugar regularly.  Denies any systemic complaints such as fevers, chills, nausea, vomiting.   Sharlene Dory, DO  A1c: 6.4 on September 18, 2020  Objective: AAO 3, NAD DP/PT pulses palpable, CRT less than 3 seconds Protective sensation decreased with Dorann Ou monofilament Nails hypertrophic, dystrophic, elongated, brittle, discolored 10. There is tenderness overlying the nails 1-5 bilaterally. There is no surrounding erythema or drainage along the nail sites. Dry skin is present without any open lesions, fissures. No pain with calf compression, swelling, warmth, erythema.  Assessment: Patient presents with symptomatic onychomycosis  Plan: -Treatment options including alternatives, risks, complications were discussed -Nails sharply debrided 10 without complication/bleeding. -Discussed daily foot inspection. If there are any changes, to call the office immediately. Continue with supportive shoes. Surgical site well healed.  -Follow-up in 3 months or sooner if any problems are to arise. In the meantime, encouraged to call the office with any questions, concerns, changes symptoms.  Ovid Curd, DPM

## 2020-11-01 ENCOUNTER — Other Ambulatory Visit: Payer: Medicare PPO

## 2020-11-03 ENCOUNTER — Other Ambulatory Visit: Payer: Self-pay | Admitting: Family Medicine

## 2020-11-03 ENCOUNTER — Other Ambulatory Visit (INDEPENDENT_AMBULATORY_CARE_PROVIDER_SITE_OTHER): Payer: Medicare PPO

## 2020-11-03 ENCOUNTER — Other Ambulatory Visit: Payer: Self-pay

## 2020-11-03 DIAGNOSIS — E785 Hyperlipidemia, unspecified: Secondary | ICD-10-CM | POA: Diagnosis not present

## 2020-11-03 DIAGNOSIS — J069 Acute upper respiratory infection, unspecified: Secondary | ICD-10-CM

## 2020-11-03 LAB — LIPID PANEL
Cholesterol: 142 mg/dL (ref 0–200)
HDL: 30.7 mg/dL — ABNORMAL LOW (ref >39.00)
LDL Cholesterol: 77 mg/dL (ref 0–99)
NonHDL: 110.82
Total CHOL/HDL Ratio: 5
Triglycerides: 171 mg/dL — ABNORMAL HIGH (ref 0.0–149.0)
VLDL: 34.2 mg/dL (ref 0.0–40.0)

## 2020-11-14 ENCOUNTER — Ambulatory Visit
Admission: RE | Admit: 2020-11-14 | Discharge: 2020-11-14 | Disposition: A | Discharge status: Home / Self Care | Payer: Medicare PPO | Source: Ambulatory Visit | Attending: Neurological Surgery | Admitting: Neurological Surgery

## 2020-11-14 DIAGNOSIS — M47816 Spondylosis without myelopathy or radiculopathy, lumbar region: Secondary | ICD-10-CM

## 2020-11-16 DIAGNOSIS — M5136 Other intervertebral disc degeneration, lumbar region: Secondary | ICD-10-CM | POA: Insufficient documentation

## 2021-02-02 ENCOUNTER — Ambulatory Visit (INDEPENDENT_AMBULATORY_CARE_PROVIDER_SITE_OTHER): Payer: Medicare PPO | Admitting: Podiatry

## 2021-02-02 ENCOUNTER — Other Ambulatory Visit: Payer: Self-pay

## 2021-02-02 ENCOUNTER — Encounter: Payer: Self-pay | Admitting: Podiatry

## 2021-02-02 DIAGNOSIS — M79675 Pain in left toe(s): Secondary | ICD-10-CM | POA: Diagnosis not present

## 2021-02-02 DIAGNOSIS — B351 Tinea unguium: Secondary | ICD-10-CM

## 2021-02-02 DIAGNOSIS — E119 Type 2 diabetes mellitus without complications: Secondary | ICD-10-CM | POA: Diagnosis not present

## 2021-02-02 DIAGNOSIS — M79674 Pain in right toe(s): Secondary | ICD-10-CM

## 2021-02-02 NOTE — Progress Notes (Signed)
Subjective: 70 y.o. returns the office today for painful, elongated, thickened toenails which he cannot trim himself.  Denies any open lesions.  Left foot from the surgery but doing great without any issues.  He still does not check his blood sugar regularly.  Denies any systemic complaints such as fevers, chills, nausea, vomiting.   Sharlene Dory, DO  A1c: 6.4 on September 18, 2020  Objective: AAO 3, NAD DP/PT pulses palpable, CRT less than 3 seconds Protective sensation decreased with Dorann Ou monofilament Nails hypertrophic, dystrophic, elongated, brittle, discolored 10. There is tenderness overlying the nails 1-5 bilaterally. There is no surrounding erythema or drainage along the nail sites. Dry skin is present without any open lesions, fissures. No pain with calf compression, swelling, warmth, erythema.  Assessment: Patient presents with symptomatic onychomycosis  Plan: -Treatment options including alternatives, risks, complications were discussed -Nails sharply debrided 10 without complication/bleeding. -Discussed daily foot inspection. If there are any changes, to call the office immediately. Continue with supportive shoes. Surgical site well healed.  -Discussed checking blood sugar on a regular basis. -Follow-up in 3 months or sooner if any problems are to arise. In the meantime, encouraged to call the office with any questions, concerns, changes symptoms.  Ovid Curd, DPM

## 2021-02-14 ENCOUNTER — Telehealth: Payer: Self-pay | Admitting: Family Medicine

## 2021-02-14 NOTE — Telephone Encounter (Signed)
Pt. Wife called in and stated a PA needs to be submitted for Dexcom and supplies in order for pt to receive.

## 2021-02-14 NOTE — Telephone Encounter (Signed)
I have not seen a PA for this yet but will work on it once I do receive it.

## 2021-03-11 NOTE — Progress Notes (Signed)
Subjective:   Todd Parks is a 70 y.o. male who presents for an Initial Medicare Annual Wellness Visit.  I connected with Jkai today by telephone and verified that I am speaking with the correct person using two identifiers. Location patient: home Location provider: work Persons participating in the virtual visit: patient, Marine scientist.    I discussed the limitations, risks, security and privacy concerns of performing an evaluation and management service by telephone and the availability of in person appointments. I also discussed with the patient that there may be a patient responsible charge related to this service. The patient expressed understanding and verbally consented to this telephonic visit.    Interactive audio and video telecommunications were attempted between this provider and patient, however failed, due to patient having technical difficulties OR patient did not have access to video capability.  We continued and completed visit with audio only.  Some vital signs may be absent or patient reported.   Time Spent with patient on telephone encounter: 20 minutes   Review of Systems     Cardiac Risk Factors include: advanced age (>56mn, >>74women);male gender;diabetes mellitus;dyslipidemia;hypertension;obesity (BMI >30kg/m2)     Objective:    Today's Vitals   03/12/21 0820  Weight: 202 lb (91.6 kg)  Height: _0  (1.727 m)   Body mass index is 30.71 kg/m.  Advanced Directives 03/12/2021  Does Patient Have a Medical Advance Directive? Yes  [place]  [place]? No - copy requested    Current Medications (verified) Outpatient Encounter Medications as of 03/12/2021  Medication Sig   ACCU-CHEK AVIVA PLUS test strip    ACCU-CHEK SOFTCLIX LANCETS lancets    aspirin EC 81 MG tablet Take by mouth.   BD PEN NEEDLE NANO 2ND GEN 32G X 4 MM MISC USE ONCE DAILY WITH LEVEMIR PEN   Blood  Glucose Monitoring Suppl (FIFTY50 GLUCOSE METER 2.0) w/Device KIT Use as instructed   Cholecalciferol (VITAMIN D3) 50 MCG (2000 UT) capsule    Clobetasol Propionate 0.05 % shampoo APPLY TO AFFECTED AREA EVERY DAY   fenofibrate (TRICOR) 145 MG tablet Take 145 mg by mouth daily.   fenofibrate (TRICOR) 145 MG tablet Take 1 tablet by mouth daily.   INVOKANA 300 MG TABS tablet TAKE 1 TABLET BY MOUTH EVERY DAY   LEVEMIR FLEXTOUCH 100 UNIT/ML FlexPen INJECT 35 UNITS SUBCUTANEOUSLY DAILY   metFORMIN (GLUCOPHAGE) 1000 MG tablet TAKE 1 TABLET (1,000 MG TOTAL) BY MOUTH 2 TIMES DAILY WITH MEALS.   metoprolol succinate (TOPROL-XL) 100 MG 24 hr tablet TAKE 1 TABLET BY MOUTH EVERY DAY   montelukast (SINGULAIR) 10 MG tablet TAKE 1 TABLET BY MOUTH EVERY DAY AT NIGHT   nitroGLYCERIN (NITROSTAT) 0.4 MG SL tablet Place under the tongue.   omeprazole (PRILOSEC) 20 MG capsule TAKE 1 CAPSULE BY MOUTH EVERY DAY   promethazine-dextromethorphan (PROMETHAZINE-DM) 6.25-15 MG/5ML syrup Take 5 mLs by mouth 4 (four) times daily as needed for cough.   rosuvastatin (CRESTOR) 5 MG tablet Take 5 mg by mouth daily.   tiZANidine (ZANAFLEX) 4 MG tablet Take 1 tablet by mouth 3 (three) times daily as needed.   valsartan (DIOVAN) 160 MG tablet Take 160 mg by mouth daily.   metoprolol tartrate (LOPRESSOR) 100 MG tablet    No facility-administered encounter medications on file as of 03/12/2021.    Allergies (verified) Patient has no known allergies.   History: Past Medical History:  Diagnosis Date  Diabetes mellitus without complication ([place])    GERD (gastroesophageal reflux disease)    History of heart attack 2006   Hyperlipidemia    Hypertension    Past Surgical History:  Procedure Laterality Date   BREAST SURGERY     mass   CARPAL TUNNEL RELEASE Bilateral    ROTATOR CUFF REPAIR Bilateral    VASECTOMY     Family History  Problem Relation Age of Onset   Cancer Neg Hx    Social History   Socioeconomic History    Marital status: Married    Spouse name: Not on file   Number of children: Not on file   Years of education: Not on file   Highest education level: Not on file  Occupational History   Not on file  Tobacco Use   Smoking status: Never   Smokeless tobacco: Never  Substance and Sexual Activity   Alcohol use: Never   Drug use: Never   Sexual activity: Not on file  Other Topics Concern   Not on file  Social History Narrative   Not on file   Social Determinants of Health   Financial Resource Strain: Low Risk    Difficulty of Paying Living Expenses: Not hard at all  Food Insecurity: No Food Insecurity   Worried About Charity fundraiser in the Last Year: Never true   Ran Out of Food in the Last Year: Never true  Transportation Needs: No Transportation Needs   Lack of Transportation (Medical): No   Lack of Transportation (Non-Medical): No  Physical Activity: Inactive   Days of Exercise per Week: 0 days   Minutes of Exercise per Session: 0 min  Stress: No Stress Concern Present   Feeling of Stress : Not at all  Social Connections: Moderately Isolated   Frequency of Communication with Friends and Family: More than three times a week   Frequency of Social Gatherings with Friends and Family: More than three times a week   Attends Religious Services: Never   Marine scientist or Organizations: No   Attends Music therapist: Never   Marital Status: Married    Tobacco Counseling Counseling given: Not Answered   Clinical Intake:  Pre-visit preparation completed: Yes  Pain : No/denies pain     BMI - recorded: 30.71 Nutritional Status: BMI > 30  Obese Nutritional Risks: None Diabetes: Yes CBG done?: No Did pt. bring in CBG monitor from home?: No (phone visit)  How often do you need to have someone help you when you read instructions, pamphlets, or other written materials from your doctor or pharmacy?: 1 - Never  Diabetes:  Is the patient diabetic?   Yes  If diabetic, was a CBG obtained today?  Yes  Did the patient bring in their glucometer from home?  No phone visit How often do you monitor your CBG's? never.   Financial Strains and Diabetes Management:  Are you having any financial strains with the device, your supplies or your medication? No .  Does the patient want to be seen by Chronic Care Management for management of their diabetes?  No  Would the patient like to be referred to a Nutritionist or for Diabetic Management?  No   Diabetic Exams:  Diabetic Eye Exam: Completed 04/26/2020.   Diabetic Foot Exam: Completed 10/27/2020.  Interpreter Needed?: No  Information entered by :: Caroleen Hamman LPN   Activities of Daily Living In your present state of health, do you have any difficulty performing  the following activities: 03/12/2021 09/19/2020  Hearing? N Y  Comment - wears hearing aids  Vision? N N  Difficulty concentrating or making decisions? N N  Walking or climbing stairs? N N  Dressing or bathing? N N  Doing errands, shopping? N N  Preparing Food and eating ? N -  Using the Toilet? N -  In the past six months, have you accidently leaked urine? N -  Do you have problems with loss of bowel control? N -  Managing your Medications? N -  Managing your Finances? N -  Housekeeping or managing your Housekeeping? N -  Some recent data might be hidden    Patient Care Team: Shelda Pal, DO as PCP - General (Family Medicine)  Indicate any recent Medical Services you may have received from other than [facility] providers in the past year (date may be approximate).     Assessment:   This is a routine wellness examination for Miguel.  Hearing/Vision screen Hearing Screening - Comments:: Bilateral hearing aids Vision Screening - Comments:: Last eye exam-04/2020-Dr. Sharlyn Bologna  Dietary issues and exercise activities discussed: Current Exercise Habits: The patient does not participate in regular exercise at present,  Exercise limited by: None identified   Goals Addressed             This Visit's Progress    Patient Stated       Maintain current health       Depression Screen PHQ 2/9 Scores 03/12/2021 09/19/2020  PHQ - 2 Score 0 0    Fall Risk Fall Risk  03/12/2021  Falls in the past year? 0  Number falls in past yr: 0  Injury with Fall? 0  Follow up Falls prevention discussed    FALL RISK PREVENTION PERTAINING TO THE HOME:  Any stairs in or around the home? Yes  If so, are there any without handrails? No  Home free of loose throw rugs in walkways, pet beds, electrical cords, etc? Yes  Adequate lighting in your home to reduce risk of falls? Yes   ASSISTIVE DEVICES UTILIZED TO PREVENT FALLS:  Life alert? No  Use of a cane, walker or w/c? No  Grab bars in the bathroom? No  Shower chair or bench in shower? No  Elevated toilet seat or a handicapped toilet? No   TIMED UP AND GO:  Was the test performed? No . Phone visit   Cognitive Function:Normal cognitive status assessed by this Nurse Health Advisor. No abnormalities found.          Immunizations Immunization History  Administered Date(s) Administered   Fluad Quad(high Dose 65+) 03/20/2020   Influenza, High Dose Seasonal PF 04/03/2016, 04/03/2017, 04/06/2018, 03/31/2019   Pneumococcal Conjugate-13 04/03/2016   Pneumococcal Polysaccharide-23 04/03/2017   Tdap 06/17/2012   Zoster Recombinat (Shingrix) 04/01/2019   Zoster, Live 06/18/2011    TDAP status: Up to date  Flu Vaccine status: Due, Education has been provided regarding the importance of this vaccine. Advised may receive this vaccine at local pharmacy or Health Dept. Aware to provide a copy of the vaccination record if obtained from local pharmacy or Health Dept. Verbalized acceptance and understanding.  Pneumococcal vaccine status: Up to date  Covid-19 vaccine status: Declined, Education has been provided regarding the importance of this vaccine but patient  still declined. Advised may receive this vaccine at local pharmacy or Health Dept.or vaccine clinic. Aware to provide a copy of the vaccination record if obtained from local pharmacy or Health Dept. Verbalized acceptance  and understanding.  Qualifies for Shingles Vaccine? No   Zostavax completed Yes   Shingrix Completed?: Yes  Screening Tests Health Maintenance  Topic Date Due   Zoster Vaccines- Shingrix (2 of 2) 05/27/2019   INFLUENZA VACCINE  01/15/2021   COVID-19 Vaccine (1) 03/28/2021 (Originally 03/18/1951)   HEMOGLOBIN A1C  03/21/2021   OPHTHALMOLOGY EXAM  04/26/2021   FOOT EXAM  10/27/2021   TETANUS/TDAP  06/17/2022   COLONOSCOPY (Pts 45-19yr Insurance coverage will need to be confirmed)  11/30/2029   Hepatitis C Screening  Completed   HPV VACCINES  Aged Out    Health Maintenance  Health Maintenance Due  Topic Date Due   Zoster Vaccines- Shingrix (2 of 2) 05/27/2019   INFLUENZA VACCINE  01/15/2021    Colorectal cancer screening: No longer required.   Lung Cancer Screening: (Low Dose CT Chest recommended if Age 729-80years, 30 pack-year currently smoking OR have quit w/in 15years.) does not qualify.     Additional Screening:  Hepatitis C Screening: Completed 09/19/2020  Vision Screening: Recommended annual ophthalmology exams for early detection of glaucoma and other disorders of the eye. Is the patient up to date with their annual eye exam?  Yes  Who is the provider or what is the name of the office in which the patient attends annual eye exams? Dr. OSharlyn Bologna  Dental Screening: Recommended annual dental exams for proper oral hygiene  Community Resource Referral / Chronic Care Management: CRR required this visit?  No   CCM required this visit?  No      Plan:     I have personally reviewed and noted the following in the patient's chart:   Medical and social history Use of alcohol, tobacco or illicit drugs  Current medications and supplements including  opioid prescriptions. Patient is not currently taking opioid prescriptions. Functional ability and status Nutritional status Physical activity Advanced directives List of other physicians Hospitalizations, surgeries, and ER visits in previous 12 months Vitals Screenings to include cognitive, depression, and falls Referrals and appointments  In addition, I have reviewed and discussed with patient certain preventive protocols, quality metrics, and best practice recommendations. A written personalized care plan for preventive services as well as general preventive health recommendations were provided to patient.    Due to this being a telephonic visit, the after visit summary with patients personalized plan was offered to patient via mail or my-chart. Patient would like to access on my-chart.   MMarta Antu LPN   [id number] Nurse Health Advisor  Nurse Notes: None

## 2021-03-12 ENCOUNTER — Ambulatory Visit (INDEPENDENT_AMBULATORY_CARE_PROVIDER_SITE_OTHER): Payer: Medicare PPO

## 2021-03-12 VITALS — Ht 68.0 in | Wt 202.0 lb

## 2021-03-12 DIAGNOSIS — Z0000 Encounter for general adult medical examination without abnormal findings: Secondary | ICD-10-CM | POA: Diagnosis not present

## 2021-03-12 NOTE — Patient Instructions (Signed)
Todd Parks , Thank you for taking time to complete your Medicare Wellness Visit. I appreciate your ongoing commitment to your health goals. Please review the following plan we discussed and let me know if I can assist you in the future.   Screening recommendations/referrals: Colonoscopy: No longer required Recommended yearly ophthalmology/optometry visit for glaucoma screening and checkup Recommended yearly dental visit for hygiene and checkup  Vaccinations: Influenza vaccine: Due-May obtain vaccine at our office or your local pharmacy. Pneumococcal vaccine: Up to date Tdap vaccine: Up to date Shingles vaccine: Completed vaccines   Covid-19: Declined  Advanced directives: Please bring a copy for your chart.  Conditions/risks identified: See problem list  Next appointment: Follow up in one year for your annual wellness visit. 03/14/2022 @ 9:00  Preventive Care 70 Years and Older, Male Preventive care refers to lifestyle choices and visits with your health care provider that can promote health and wellness. What does preventive care include? A yearly physical exam. This is also called an annual well check. Dental exams once or twice a year. Routine eye exams. Ask your health care provider how often you should have your eyes checked. Personal lifestyle choices, including: Daily care of your teeth and gums. Regular physical activity. Eating a healthy diet. Avoiding tobacco and drug use. Limiting alcohol use. Practicing safe sex. Taking low doses of aspirin every day. Taking vitamin and mineral supplements as recommended by your health care provider. What happens during an annual well check? The services and screenings done by your health care provider during your annual well check will depend on your age, overall health, lifestyle risk factors, and family history of disease. Counseling  Your health care provider may ask you questions about your: Alcohol use. Tobacco use. Drug  use. Emotional well-being. Home and relationship well-being. Sexual activity. Eating habits. History of falls. Memory and ability to understand (cognition). Work and work Astronomer. Screening  You may have the following tests or measurements: Height, weight, and BMI. Blood pressure. Lipid and cholesterol levels. These may be checked every 5 years, or more frequently if you are over 70 years old. Skin check. Lung cancer screening. You may have this screening every year starting at age 70 if you have a 30-pack-year history of smoking and currently smoke or have quit within the past 15 years. Fecal occult blood test (FOBT) of the stool. You may have this test every year starting at age 70. Flexible sigmoidoscopy or colonoscopy. You may have a sigmoidoscopy every 5 years or a colonoscopy every 10 years starting at age 70. Prostate cancer screening. Recommendations will vary depending on your family history and other risks. Hepatitis C blood test. Hepatitis B blood test. Sexually transmitted disease (STD) testing. Diabetes screening. This is done by checking your blood sugar (glucose) after you have not eaten for a while (fasting). You may have this done every 1-3 years. Abdominal aortic aneurysm (AAA) screening. You may need this if you are a current or former smoker. Osteoporosis. You may be screened starting at age 46 if you are at high risk. Talk with your health care provider about your test results, treatment options, and if necessary, the need for more tests. Vaccines  Your health care provider may recommend certain vaccines, such as: Influenza vaccine. This is recommended every year. Tetanus, diphtheria, and acellular pertussis (Tdap, Td) vaccine. You may need a Td booster every 10 years. Zoster vaccine. You may need this after age 34. Pneumococcal 13-valent conjugate (PCV13) vaccine. One dose is recommended after  age 8. Pneumococcal polysaccharide (PPSV23) vaccine. One dose is  recommended after age 29. Talk to your health care provider about which screenings and vaccines you need and how often you need them. This information is not intended to replace advice given to you by your health care provider. Make sure you discuss any questions you have with your health care provider. Document Released: 06/30/2015 Document Revised: 02/21/2016 Document Reviewed: 04/04/2015 Elsevier Interactive Patient Education  2017 [employer] Prevention in the Home Falls can cause injuries. They can happen to people of all ages. There are many things you can do to make your home safe and to help prevent falls. What can I do on the outside of my home? Regularly fix the edges of walkways and driveways and fix any cracks. Remove anything that might make you trip as you walk through a door, such as a raised step or threshold. Trim any bushes or trees on the path to your home. Use bright outdoor lighting. Clear any walking paths of anything that might make someone trip, such as rocks or tools. Regularly check to see if handrails are loose or broken. Make sure that both sides of any steps have handrails. Any raised decks and porches should have guardrails on the edges. Have any leaves, snow, or ice cleared regularly. Use sand or salt on walking paths during winter. Clean up any spills in your garage right away. This includes oil or grease spills. What can I do in the bathroom? Use night lights. Install grab bars by the toilet and in the tub and shower. Do not use towel bars as grab bars. Use non-skid mats or decals in the tub or shower. If you need to sit down in the shower, use a plastic, non-slip stool. Keep the floor dry. Clean up any water that spills on the floor as soon as it happens. Remove soap buildup in the tub or shower regularly. Attach bath mats securely with double-sided non-slip rug tape. Do not have throw rugs and other things on the floor that can make you  trip. What can I do in the bedroom? Use night lights. Make sure that you have a light by your bed that is easy to reach. Do not use any sheets or blankets that are too big for your bed. They should not hang down onto the floor. Have a firm chair that has side arms. You can use this for support while you get dressed. Do not have throw rugs and other things on the floor that can make you trip. What can I do in the kitchen? Clean up any spills right away. Avoid walking on wet floors. Keep items that you use a lot in easy-to-reach places. If you need to reach something above you, use a strong step stool that has a grab bar. Keep electrical cords out of the way. Do not use floor polish or wax that makes floors slippery. If you must use wax, use non-skid floor wax. Do not have throw rugs and other things on the floor that can make you trip. What can I do with my stairs? Do not leave any items on the stairs. Make sure that there are handrails on both sides of the stairs and use them. Fix handrails that are broken or loose. Make sure that handrails are as long as the stairways. Check any carpeting to make sure that it is firmly attached to the stairs. Fix any carpet that is loose or worn. Avoid having throw rugs  at the top or bottom of the stairs. If you do have throw rugs, attach them to the floor with carpet tape. Make sure that you have a light switch at the top of the stairs and the bottom of the stairs. If you do not have them, ask someone to add them for you. What else can I do to help prevent falls? Wear shoes that: Do not have high heels. Have rubber bottoms. Are comfortable and fit you well. Are closed at the toe. Do not wear sandals. If you use a stepladder: Make sure that it is fully opened. Do not climb a closed stepladder. Make sure that both sides of the stepladder are locked into place. Ask someone to hold it for you, if possible. Clearly mark and make sure that you can  see: Any grab bars or handrails. First and last steps. Where the edge of each step is. Use tools that help you move around (mobility aids) if they are needed. These include: Canes. Walkers. Scooters. Crutches. Turn on the lights when you go into a dark area. Replace any light bulbs as soon as they burn out. Set up your furniture so you have a clear path. Avoid moving your furniture around. If any of your floors are uneven, fix them. If there are any pets around you, be aware of where they are. Review your medicines with your doctor. Some medicines can make you feel dizzy. This can increase your chance of falling. Ask your doctor what other things that you can do to help prevent falls. This information is not intended to replace advice given to you by your health care provider. Make sure you discuss any questions you have with your health care provider. Document Released: 03/30/2009 Document Revised: 11/09/2015 Document Reviewed: 07/08/2014 Elsevier Interactive Patient Education  2017 [employer].

## 2021-03-14 ENCOUNTER — Other Ambulatory Visit: Payer: Self-pay | Admitting: Family Medicine

## 2021-03-21 ENCOUNTER — Ambulatory Visit: Payer: Medicare PPO | Admitting: Family Medicine

## 2021-03-26 ENCOUNTER — Ambulatory Visit: Payer: Medicare PPO | Admitting: Family Medicine

## 2021-03-28 DIAGNOSIS — G4733 Obstructive sleep apnea (adult) (pediatric): Secondary | ICD-10-CM | POA: Diagnosis not present

## 2021-04-09 ENCOUNTER — Ambulatory Visit: Payer: Medicare PPO | Admitting: Family Medicine

## 2021-04-09 ENCOUNTER — Other Ambulatory Visit: Payer: Self-pay

## 2021-04-09 ENCOUNTER — Encounter: Payer: Self-pay | Admitting: Family Medicine

## 2021-04-09 VITALS — BP 108/64 | HR 67 | Temp 98.2°F | Ht 68.0 in | Wt 201.2 lb

## 2021-04-09 DIAGNOSIS — Z23 Encounter for immunization: Secondary | ICD-10-CM

## 2021-04-09 DIAGNOSIS — I10 Essential (primary) hypertension: Secondary | ICD-10-CM

## 2021-04-09 DIAGNOSIS — E669 Obesity, unspecified: Secondary | ICD-10-CM | POA: Diagnosis not present

## 2021-04-09 DIAGNOSIS — E1169 Type 2 diabetes mellitus with other specified complication: Secondary | ICD-10-CM | POA: Diagnosis not present

## 2021-04-09 LAB — COMPREHENSIVE METABOLIC PANEL
ALT: 25 U/L (ref 0–53)
AST: 23 U/L (ref 0–37)
Albumin: 4.8 g/dL (ref 3.5–5.2)
Alkaline Phosphatase: 35 U/L — ABNORMAL LOW (ref 39–117)
BUN: 19 mg/dL (ref 6–23)
CO2: 29 mEq/L (ref 19–32)
Calcium: 9.8 mg/dL (ref 8.4–10.5)
Chloride: 101 mEq/L (ref 96–112)
Creatinine, Ser: 1.46 mg/dL (ref 0.40–1.50)
GFR: 48.4 mL/min — ABNORMAL LOW (ref >60.00)
Glucose, Bld: 120 mg/dL — ABNORMAL HIGH (ref 70–99)
Potassium: 4.6 mEq/L (ref 3.5–5.1)
Sodium: 139 mEq/L (ref 135–145)
Total Bilirubin: 0.8 mg/dL (ref 0.2–1.2)
Total Protein: 7.3 g/dL (ref 6.0–8.3)

## 2021-04-09 LAB — LIPID PANEL
Cholesterol: 148 mg/dL (ref 0–200)
HDL: 30.1 mg/dL — ABNORMAL LOW (ref >39.00)
NonHDL: 117.89
Total CHOL/HDL Ratio: 5
Triglycerides: 218 mg/dL — ABNORMAL HIGH (ref 0.0–149.0)
VLDL: 43.6 mg/dL — ABNORMAL HIGH (ref 0.0–40.0)

## 2021-04-09 LAB — HEMOGLOBIN A1C: Hgb A1c MFr Bld: 6.6 % — ABNORMAL HIGH (ref 4.6–6.5)

## 2021-04-09 LAB — LDL CHOLESTEROL, DIRECT: Direct LDL: 99 mg/dL

## 2021-04-09 MED ORDER — ACCU-CHEK SOFTCLIX LANCETS MISC: 3 refills | Status: AC

## 2021-04-09 MED ORDER — ACCU-CHEK AVIVA PLUS VI STRP: ORAL_STRIP | 3 refills | Status: AC

## 2021-04-09 NOTE — Addendum Note (Signed)
Addended by: Scharlene Gloss B on: 04/09/2021 09:06 AM   Modules accepted: Orders

## 2021-04-09 NOTE — Progress Notes (Signed)
Subjective:  CC: Diabetes follow up   Todd Parks is a 70 y.o. male here for follow-up of diabetes.   Colan does not routinely check his sugars.  Patient does require insulin. Levemir 35 u qhs Medications include: metformin 1000 mg bid, Invokana 300 mg/d.  Diet could be better.  Exercise: active around house No Cp or SOB.  Hypertension Patient presents for hypertension follow up. He does monitor home blood pressures. He is compliant with medications- valsartan 160 mg/d, Toprol XL 100 mg/d. Patient has these side effects of medication: none Diet/exercise as above.  Past Medical History:  Diagnosis Date   Diabetes mellitus without complication (HCC)    GERD (gastroesophageal reflux disease)    History of heart attack 2006   Hyperlipidemia    Hypertension      Related testing: Retinal exam: Done Pneumovax: done  Objective:  BP 108/64   Pulse 67   Temp 98.2 F (36.8 C) (Oral)   Ht 5\' 8"  (1.727 m)   Wt 201 lb 4 oz (91.3 kg)   SpO2 96%   BMI 30.60 kg/m  General:  Well developed, well nourished, in no apparent distress Head:  Normocephalic, atraumatic Eyes:  Pupils equal and round, sclera anicteric without injection  Lungs:  CTAB, no access msc use Cardio:  RRR, no bruits, no LE edema Psych: Age appropriate judgment and insight  Assessment:   Diabetes mellitus type 2 in obese (HCC) - Plan: Lipid panel, Hemoglobin A1c, Comprehensive metabolic panel  Essential hypertension  Need for influenza vaccination - Plan: Flu Vaccine QUAD High Dose(Fluad)   Plan:   Chronic, stable. Cont Levemir 35 u qhs, Metformin 1000 mg bid, Invokana 300 mg/d. Counseled on diet and exercise. Chronic, stable. Cont Diovan 160 mg/d, Toprol XL 100 mg/d. Flu shot today. Will find out when Shingrix vaccines were given in 2020 and send me a message. Declines covid vaccination. Offered information. Precontemplative.  F/u in 6 mo. The patient voiced understanding and agreement to the  plan.  2021 [place], DO 04/09/21 8:55 AM

## 2021-04-09 NOTE — Patient Instructions (Signed)
Give [place] 2-3 business days to get the results of your labs back.   Have your wife send me a message when your 2 shingles vaccines (Shingrix) please.  Keep the diet clean and stay active.  Aim to do some physical exertion for 150 minutes per week. This is typically divided into 5 days per week, 30 minutes per day. The activity should be enough to get your heart rate up. Anything is better than nothing if you have time constraints.  Let us know if you need anything.

## 2021-04-10 ENCOUNTER — Other Ambulatory Visit: Payer: Self-pay | Admitting: Family Medicine

## 2021-04-10 DIAGNOSIS — E785 Hyperlipidemia, unspecified: Secondary | ICD-10-CM

## 2021-04-10 MED ORDER — ROSUVASTATIN CALCIUM 20 MG PO TABS: 10.0000 mg | ORAL_TABLET | Freq: Every day | ORAL | 3 refills | Status: DC

## 2021-05-01 NOTE — Telephone Encounter (Signed)
error 

## 2021-05-08 DIAGNOSIS — L814 Other melanin hyperpigmentation: Secondary | ICD-10-CM | POA: Diagnosis not present

## 2021-05-08 DIAGNOSIS — Z85828 Personal history of other malignant neoplasm of skin: Secondary | ICD-10-CM | POA: Diagnosis not present

## 2021-05-08 DIAGNOSIS — L218 Other seborrheic dermatitis: Secondary | ICD-10-CM | POA: Diagnosis not present

## 2021-05-08 DIAGNOSIS — L579 Skin changes due to chronic exposure to nonionizing radiation, unspecified: Secondary | ICD-10-CM | POA: Diagnosis not present

## 2021-05-08 DIAGNOSIS — L905 Scar conditions and fibrosis of skin: Secondary | ICD-10-CM | POA: Diagnosis not present

## 2021-05-08 DIAGNOSIS — L821 Other seborrheic keratosis: Secondary | ICD-10-CM | POA: Diagnosis not present

## 2021-05-14 ENCOUNTER — Ambulatory Visit: Payer: Medicare PPO | Admitting: Podiatry

## 2021-05-14 ENCOUNTER — Other Ambulatory Visit: Payer: Self-pay

## 2021-05-14 ENCOUNTER — Encounter: Payer: Self-pay | Admitting: Podiatry

## 2021-05-14 DIAGNOSIS — M79675 Pain in left toe(s): Secondary | ICD-10-CM

## 2021-05-14 DIAGNOSIS — E119 Type 2 diabetes mellitus without complications: Secondary | ICD-10-CM

## 2021-05-14 DIAGNOSIS — B351 Tinea unguium: Secondary | ICD-10-CM | POA: Diagnosis not present

## 2021-05-14 DIAGNOSIS — M79674 Pain in right toe(s): Secondary | ICD-10-CM | POA: Diagnosis not present

## 2021-05-16 NOTE — Progress Notes (Signed)
Subjective: 70 y.o. returns the office today for painful, elongated, thickened toenails which he cannot trim himself.  He has no other concerns.  Denies any systemic complaints such as fevers, chills, nausea, vomiting.   Sharlene Dory, DO  A1c: 6.6 on 04/09/2021  Objective: AAO 3, NAD DP/PT pulses palpable, CRT less than 3 seconds Protective sensation decreased with Simms Weinstein monofilament Nails hypertrophic, dystrophic, elongated, brittle, discolored 10. There is tenderness overlying the nails 1-5 bilaterally. There is no surrounding erythema or drainage along the nail sites. Dry skin is present without any open lesions, fissures. No pain with calf compression, swelling, warmth, erythema.  Assessment: Patient presents with symptomatic onychomycosis  Plan: -Treatment options including alternatives, risks, complications were discussed -Nails sharply debrided 10 without complication/bleeding. -Discussed daily foot inspection. If there are any changes, to call the office immediately. Continue with supportive shoes. Surgical site well healed.  -Discussed checking blood sugar on a regular basis. -Follow-up in 3 months or sooner if any problems are to arise. In the meantime, encouraged to call the office with any questions, concerns, changes symptoms.  Ovid Curd, DPM

## 2021-05-23 ENCOUNTER — Other Ambulatory Visit (INDEPENDENT_AMBULATORY_CARE_PROVIDER_SITE_OTHER): Payer: Medicare PPO

## 2021-05-23 DIAGNOSIS — E785 Hyperlipidemia, unspecified: Secondary | ICD-10-CM | POA: Diagnosis not present

## 2021-05-23 LAB — LIPID PANEL
Cholesterol: 138 mg/dL (ref 0–200)
HDL: 32.1 mg/dL — ABNORMAL LOW (ref >39.00)
LDL Cholesterol: 68 mg/dL (ref 0–99)
NonHDL: 105.59
Total CHOL/HDL Ratio: 4
Triglycerides: 186 mg/dL — ABNORMAL HIGH (ref 0.0–149.0)
VLDL: 37.2 mg/dL (ref 0.0–40.0)

## 2021-06-19 ENCOUNTER — Other Ambulatory Visit: Payer: Self-pay | Admitting: Family Medicine

## 2021-07-02 LAB — HM DIABETES EYE EXAM

## 2021-07-17 ENCOUNTER — Encounter: Payer: Self-pay | Admitting: Family Medicine

## 2021-07-22 ENCOUNTER — Other Ambulatory Visit: Payer: Self-pay | Admitting: Family Medicine

## 2021-07-22 DIAGNOSIS — E119 Type 2 diabetes mellitus without complications: Secondary | ICD-10-CM

## 2021-07-25 ENCOUNTER — Other Ambulatory Visit: Payer: Self-pay | Admitting: Family Medicine

## 2021-07-25 DIAGNOSIS — E119 Type 2 diabetes mellitus without complications: Secondary | ICD-10-CM

## 2021-07-25 MED ORDER — LEVEMIR FLEXPEN 100 UNIT/ML ~~LOC~~ SOPN: 35.0000 [IU] | PEN_INJECTOR | Freq: Every day | SUBCUTANEOUS | 3 refills | Status: DC

## 2021-07-25 NOTE — Telephone Encounter (Signed)
Need flex pen sent in as alternative

## 2021-08-02 ENCOUNTER — Other Ambulatory Visit: Payer: Self-pay | Admitting: Family Medicine

## 2021-08-14 ENCOUNTER — Ambulatory Visit: Payer: Medicare PPO | Admitting: Podiatry

## 2021-08-14 ENCOUNTER — Other Ambulatory Visit: Payer: Self-pay

## 2021-08-14 DIAGNOSIS — E119 Type 2 diabetes mellitus without complications: Secondary | ICD-10-CM

## 2021-08-14 DIAGNOSIS — B351 Tinea unguium: Secondary | ICD-10-CM

## 2021-08-14 DIAGNOSIS — M79675 Pain in left toe(s): Secondary | ICD-10-CM | POA: Diagnosis not present

## 2021-08-14 DIAGNOSIS — M79674 Pain in right toe(s): Secondary | ICD-10-CM | POA: Diagnosis not present

## 2021-08-14 NOTE — Progress Notes (Signed)
Subjective: 71 y.o. returns the office today for painful, elongated, thickened toenails which he cannot trim himself.  He has no other concerns.  He states the surgery is still doing great.  Denies any systemic complaints such as fevers, chills, nausea, vomiting.   Sharlene Dory, DO  A1c: 6.6 on 04/09/2021  Objective: AAO 3, NAD DP/PT pulses palpable, CRT less than 3 seconds Protective sensation decreased with Simms Weinstein monofilament Nails hypertrophic, dystrophic, elongated, brittle, discolored 10. There is tenderness overlying the nails 1-5 bilaterally. There is no surrounding erythema or drainage along the nail sites. Dry skin is present without any open lesions, fissures. No pain with calf compression, swelling, warmth, erythema.  Assessment: Patient presents with symptomatic onychomycosis  Plan: -Treatment options including alternatives, risks, complications were discussed -Nails sharply debrided 10 without complication/bleeding. -Discussed daily foot inspection. If there are any changes, to call the office immediately. Continue with supportive shoes. Surgical site well healed.  -Discussed checking blood sugar on a regular basis. -Follow-up in 3 months or sooner if any problems are to arise. In the meantime, encouraged to call the office with any questions, concerns, changes symptoms.  Ovid Curd, DPM

## 2021-11-06 DIAGNOSIS — Z85828 Personal history of other malignant neoplasm of skin: Secondary | ICD-10-CM | POA: Diagnosis not present

## 2021-11-06 DIAGNOSIS — L814 Other melanin hyperpigmentation: Secondary | ICD-10-CM | POA: Diagnosis not present

## 2021-11-06 DIAGNOSIS — L719 Rosacea, unspecified: Secondary | ICD-10-CM | POA: Diagnosis not present

## 2021-11-06 DIAGNOSIS — L579 Skin changes due to chronic exposure to nonionizing radiation, unspecified: Secondary | ICD-10-CM | POA: Diagnosis not present

## 2021-11-06 DIAGNOSIS — L821 Other seborrheic keratosis: Secondary | ICD-10-CM | POA: Diagnosis not present

## 2021-11-13 ENCOUNTER — Ambulatory Visit: Payer: Medicare PPO | Admitting: Podiatry

## 2021-11-13 DIAGNOSIS — E119 Type 2 diabetes mellitus without complications: Secondary | ICD-10-CM

## 2021-11-13 DIAGNOSIS — B351 Tinea unguium: Secondary | ICD-10-CM

## 2021-11-13 DIAGNOSIS — M79675 Pain in left toe(s): Secondary | ICD-10-CM | POA: Diagnosis not present

## 2021-11-13 DIAGNOSIS — M79674 Pain in right toe(s): Secondary | ICD-10-CM

## 2021-11-18 NOTE — Progress Notes (Signed)
Subjective: 71 y.o. returns the office today for painful, elongated, thickened toenails which he cannot trim himself.  He has no other concerns.  He states the surgery is still doing great still and no new concerns.  Denies any systemic complaints such as fevers, chills, nausea, vomiting.   Sharlene Dory, DO  A1c: 6.6 on 04/09/2021  Objective: AAO 3, NAD DP/PT pulses palpable, CRT less than 3 seconds Protective sensation decreased with Simms Weinstein monofilament Nails hypertrophic, dystrophic, elongated, brittle, discolored 10. There is tenderness overlying the nails 1-5 bilaterally. There is no surrounding erythema or drainage along the nail sites. Dry skin is present without any open lesions, fissures. No pain with calf compression, swelling, warmth, erythema.  Assessment: Patient presents with symptomatic onychomycosis  Plan: -Treatment options including alternatives, risks, complications were discussed -Nails sharply debrided 10 without complication/bleeding. -Discussed daily foot inspection. If there are any changes, to call the office immediately. Continue with supportive shoes. -Discussed checking blood sugar on a regular basis. -Follow-up in 3 months or sooner if any problems are to arise. In the meantime, encouraged to call the office with any questions, concerns, changes symptoms.  Ovid Curd, DPM

## 2021-12-09 ENCOUNTER — Encounter: Payer: Self-pay | Admitting: Family Medicine

## 2021-12-10 ENCOUNTER — Ambulatory Visit: Payer: Medicare PPO | Admitting: Family Medicine

## 2021-12-10 ENCOUNTER — Encounter: Payer: Self-pay | Admitting: Family Medicine

## 2021-12-10 VITALS — BP 116/61 | HR 72 | Temp 98.2°F | Ht 68.0 in | Wt 195.6 lb

## 2021-12-10 DIAGNOSIS — J069 Acute upper respiratory infection, unspecified: Secondary | ICD-10-CM

## 2021-12-10 MED ORDER — GUAIFENESIN ER 600 MG PO TB12: 1200.0000 mg | ORAL_TABLET | Freq: Two times a day (BID) | ORAL | 2 refills | Status: DC

## 2021-12-11 ENCOUNTER — Other Ambulatory Visit: Payer: Self-pay | Admitting: Family Medicine

## 2021-12-11 ENCOUNTER — Encounter: Payer: Self-pay | Admitting: Family Medicine

## 2021-12-11 DIAGNOSIS — J0140 Acute pansinusitis, unspecified: Secondary | ICD-10-CM

## 2021-12-11 MED ORDER — AMOXICILLIN-POT CLAVULANATE 875-125 MG PO TABS: 1.0000 | ORAL_TABLET | Freq: Two times a day (BID) | ORAL | 0 refills | Status: AC

## 2021-12-24 ENCOUNTER — Ambulatory Visit: Payer: Medicare PPO | Admitting: Family Medicine

## 2021-12-24 ENCOUNTER — Encounter: Payer: Self-pay | Admitting: Family Medicine

## 2021-12-24 VITALS — BP 120/62 | HR 62 | Temp 98.1°F | Ht 68.0 in | Wt 197.4 lb

## 2021-12-24 DIAGNOSIS — E1169 Type 2 diabetes mellitus with other specified complication: Secondary | ICD-10-CM | POA: Diagnosis not present

## 2021-12-24 DIAGNOSIS — I10 Essential (primary) hypertension: Secondary | ICD-10-CM

## 2021-12-24 DIAGNOSIS — E785 Hyperlipidemia, unspecified: Secondary | ICD-10-CM | POA: Diagnosis not present

## 2021-12-24 DIAGNOSIS — E669 Obesity, unspecified: Secondary | ICD-10-CM

## 2021-12-24 LAB — COMPREHENSIVE METABOLIC PANEL
ALT: 22 U/L (ref 0–53)
AST: 23 U/L (ref 0–37)
Albumin: 4.7 g/dL (ref 3.5–5.2)
Alkaline Phosphatase: 32 U/L — ABNORMAL LOW (ref 39–117)
BUN: 20 mg/dL (ref 6–23)
CO2: 28 mEq/L (ref 19–32)
Calcium: 9.3 mg/dL (ref 8.4–10.5)
Chloride: 103 mEq/L (ref 96–112)
Creatinine, Ser: 1.3 mg/dL (ref 0.40–1.50)
GFR: 55.36 mL/min — ABNORMAL LOW (ref >60.00)
Glucose, Bld: 118 mg/dL — ABNORMAL HIGH (ref 70–99)
Potassium: 4.1 mEq/L (ref 3.5–5.1)
Sodium: 139 mEq/L (ref 135–145)
Total Bilirubin: 0.8 mg/dL (ref 0.2–1.2)
Total Protein: 6.8 g/dL (ref 6.0–8.3)

## 2021-12-24 LAB — LIPID PANEL
Cholesterol: 138 mg/dL (ref 0–200)
HDL: 28.9 mg/dL — ABNORMAL LOW (ref >39.00)
LDL Cholesterol: 73 mg/dL (ref 0–99)
NonHDL: 108.88
Total CHOL/HDL Ratio: 5
Triglycerides: 180 mg/dL — ABNORMAL HIGH (ref 0.0–149.0)
VLDL: 36 mg/dL (ref 0.0–40.0)

## 2021-12-24 LAB — MICROALBUMIN / CREATININE URINE RATIO
Creatinine,U: 70.7 mg/dL
Microalb Creat Ratio: 1 mg/g (ref 0.0–30.0)
Microalb, Ur: <0.7 mg/dL (ref 0.0–1.9)

## 2021-12-24 LAB — HEMOGLOBIN A1C: Hgb A1c MFr Bld: 6.7 % — ABNORMAL HIGH (ref 4.6–6.5)

## 2021-12-24 MED ORDER — ROSUVASTATIN CALCIUM 20 MG PO TABS: 20.0000 mg | ORAL_TABLET | Freq: Every day | ORAL | 3 refills | Status: AC

## 2021-12-24 MED ORDER — METFORMIN HCL 1000 MG PO TABS: 1000.0000 mg | ORAL_TABLET | Freq: Two times a day (BID) | ORAL | Status: DC

## 2021-12-24 NOTE — Progress Notes (Signed)
Subjective:   Chief Complaint  Patient presents with   Follow-up    Diabetes     Todd Parks is a 71 y.o. male here for follow-up of diabetes.   Todd Parks does not monitor his sugars at home.  Patient does not require insulin.   Medications include: metformin 1000 mg bid, Invokana Diet is OK, could be better.  Exercise: active around house/yard  Hyperlipidemia Patient presents for dyslipidemia follow up. Currently being treated with Crestor 20 mg/d, fenofibrate 145 mg/d and compliance with treatment thus far has been good. He denies myalgias. Diet/exercise as above.  The patient is not known to have coexisting coronary artery disease.  Hypertension Patient presents for hypertension follow up. He does not monitor home blood pressures. He is compliant with medications- Diovan 160 mg/d, Toprol XL 100 mg/d. Patient has these side effects of medication: none Diet/exercise as above.  No CP or SOB.  Past Medical History:  Diagnosis Date   Diabetes mellitus without complication (HCC)    GERD (gastroesophageal reflux disease)    History of heart attack 2006   Hyperlipidemia    Hypertension      Related testing: Retinal exam: Done Pneumovax: done  Objective:  BP 120/62   Pulse 62   Temp 98.1 F (36.7 C) (Oral)   Ht 5\' 8"  (1.727 m)   Wt 197 lb 6 oz (89.5 kg)   SpO2 99%   BMI 30.01 kg/m  General:  Well developed, well nourished, in no apparent distress Skin:  Some thickening of skin and callous formation on medial great toes and forefoot. Warm, no pallor or diaphoresis on exposed skin otherwise noted.  Lungs:  CTAB, no access msc use Cardio:  RRR, no bruits, no LE edema Musculoskeletal:  Symmetrical muscle groups noted without atrophy or deformity Neuro:  Sensation intact to pinprick on feet Psych: Age appropriate judgment and insight  Assessment:   Diabetes mellitus type 2 in obese (HCC) - Plan: Comprehensive metabolic panel, Hemoglobin A1c, Lipid panel, Microalbumin /  creatinine urine ratio  Essential hypertension  Hyperlipidemia, unspecified hyperlipidemia type   Plan:   Chronic, stable. Cont Metformin 1000 mg bid, Invokana 300 mg/d. Counseled on diet and exercise. Chronic, stable. Cont Toprol XL 100 mg/d, Diovana 160 mg/d. Chronic, unsure if stable. Cont Crestor 20 mg/d, Tricor 145 mg/d.  F/u in 6 mo. The patient voiced understanding and agreement to the plan.  [place], DO 12/24/21 7:39 AM

## 2021-12-24 NOTE — Patient Instructions (Signed)
Give us 2-3 business days to get the results of your labs back.   Keep the diet clean and stay active.  Let us know if you need anything. 

## 2021-12-28 ENCOUNTER — Other Ambulatory Visit: Payer: Self-pay | Admitting: Family Medicine

## 2022-01-11 DIAGNOSIS — Z79899 Other long term (current) drug therapy: Secondary | ICD-10-CM | POA: Diagnosis not present

## 2022-01-11 DIAGNOSIS — J301 Allergic rhinitis due to pollen: Secondary | ICD-10-CM | POA: Diagnosis not present

## 2022-01-11 DIAGNOSIS — Z7982 Long term (current) use of aspirin: Secondary | ICD-10-CM | POA: Diagnosis not present

## 2022-01-11 DIAGNOSIS — E785 Hyperlipidemia, unspecified: Secondary | ICD-10-CM | POA: Diagnosis not present

## 2022-01-11 DIAGNOSIS — I2510 Atherosclerotic heart disease of native coronary artery without angina pectoris: Secondary | ICD-10-CM | POA: Diagnosis not present

## 2022-01-28 ENCOUNTER — Other Ambulatory Visit: Payer: Self-pay | Admitting: Family Medicine

## 2022-02-14 ENCOUNTER — Ambulatory Visit: Payer: Medicare PPO | Admitting: Podiatry

## 2022-02-14 DIAGNOSIS — E119 Type 2 diabetes mellitus without complications: Secondary | ICD-10-CM

## 2022-02-14 DIAGNOSIS — B351 Tinea unguium: Secondary | ICD-10-CM | POA: Diagnosis not present

## 2022-02-14 DIAGNOSIS — M79674 Pain in right toe(s): Secondary | ICD-10-CM | POA: Diagnosis not present

## 2022-02-14 DIAGNOSIS — M79675 Pain in left toe(s): Secondary | ICD-10-CM

## 2022-02-18 NOTE — Progress Notes (Signed)
Subjective: Chief Complaint  Patient presents with   Foot Problem    DFC    71 y.o. returns the office today for painful, elongated, thickened toenails which he cannot trim himself.  He has no other concerns.  Denies any systemic complaints such as fevers, chills, nausea, vomiting.   Sharlene Dory, DO  A1c: 6.7 on 12/24/2021  Objective: AAO 3, NAD DP/PT pulses palpable, CRT less than 3 seconds Protective sensation decreased with Simms Weinstein monofilament Nails hypertrophic, dystrophic, elongated, brittle, discolored 10. There is tenderness overlying the nails 1-5 bilaterally. There is no surrounding erythema or drainage along the nail sites. Dry skin is present without any open lesions, fissures. No pain with calf compression, swelling, warmth, erythema.  Assessment: Patient presents with symptomatic onychomycosis  Plan: -Treatment options including alternatives, risks, complications were discussed -Nails sharply debrided 10 without complication/bleeding. -Discussed daily foot inspection. If there are any changes, to call the office immediately. Continue with supportive shoes. -Discussed checking blood sugar on a regular basis. -Follow-up in 3 months or sooner if any problems are to arise. In the meantime, encouraged to call the office with any questions, concerns, changes symptoms.  Ovid Curd, DPM

## 2022-03-05 ENCOUNTER — Other Ambulatory Visit: Payer: Self-pay | Admitting: Family Medicine

## 2022-03-14 ENCOUNTER — Ambulatory Visit (INDEPENDENT_AMBULATORY_CARE_PROVIDER_SITE_OTHER): Payer: Medicare PPO | Admitting: *Deleted

## 2022-03-14 DIAGNOSIS — Z0000 Encounter for general adult medical examination without abnormal findings: Secondary | ICD-10-CM

## 2022-03-14 NOTE — Progress Notes (Signed)
Subjective:   Todd Parks is a 72 y.o. male who presents for Medicare Annual/Subsequent preventive examination.  I connected with  Todd Parks on 03/14/22 by a audio enabled telemedicine application and verified that I am speaking with the correct person using two identifiers.  Patient Location: Home  Provider Location: Office/Clinic  I discussed the limitations of evaluation and management by telemedicine. The patient expressed understanding and agreed to proceed.   Review of Systems    Defer to PCP Cardiac Risk Factors include: advanced age (>31mn, >>28women);diabetes mellitus;dyslipidemia;hypertension;male gender     Objective:    There were no vitals filed for this visit. There is no height or weight on file to calculate BMI.     03/14/2022    9:03 AM 03/12/2021    8:24 AM  Advanced Directives  Does Patient Have a Medical Advance Directive? Yes Yes  Type of [place]amed[place]of [place] will [place] will  Copy of [place] Chart? No - copy requested No - copy requested    Current Medications (verified) Outpatient Encounter Medications as of 03/14/2022  Medication Sig   ACCU-CHEK AVIVA PLUS test strip Use daily to check blood sugar.  DXE11.9   Accu-Chek Softclix Lancets lancets Use daily to check blood sugar..  DX E11.9   aspirin EC 81 MG tablet Take by mouth.   BD PEN NEEDLE NANO 2ND GEN 32G X 4 MM MISC USE ONCE DAILY WITH LEVEMIR PEN   Blood Glucose Monitoring Suppl (FIFTY50 GLUCOSE METER 2.0) w/Device KIT Use as instructed   Cholecalciferol (VITAMIN D3) 50 MCG (2000 UT) capsule    Clobetasol Propionate 0.05 % shampoo APPLY TO AFFECTED AREA EVERY DAY   fenofibrate (TRICOR) 145 MG tablet Take 1 tablet by mouth daily.   INVOKANA 300 MG TABS tablet TAKE 1 TABLET BY MOUTH EVERY DAY   LEVEMIR FLEXPEN 100 UNIT/ML FlexPen INJECT 35 UNITS SUBCUTANEOUSLY DAILY   metFORMIN (GLUCOPHAGE) 1000 MG tablet TAKE  1 TABLET (1,000 MG TOTAL) BY MOUTH TWICE A DAY WITH MEALS   metoprolol succinate (TOPROL-XL) 100 MG 24 hr tablet TAKE 1 TABLET BY MOUTH EVERY DAY   nitroGLYCERIN (NITROSTAT) 0.4 MG SL tablet Place under the tongue.   omeprazole (PRILOSEC) 20 MG capsule TAKE 1 CAPSULE BY MOUTH EVERY DAY   rosuvastatin (CRESTOR) 10 MG tablet TAKE 1 TABLET BY MOUTH DAILY   rosuvastatin (CRESTOR) 20 MG tablet Take 1 tablet (20 mg total) by mouth daily.   valsartan (DIOVAN) 160 MG tablet TAKE 1 TABLET BY MOUTH EVERY DAY   No facility-administered encounter medications on file as of 03/14/2022.    Allergies (verified) Patient has no known allergies.   History: Past Medical History:  Diagnosis Date   Diabetes mellitus without complication ([place]    GERD (gastroesophageal reflux disease)    History of heart attack 2006   Hyperlipidemia    Hypertension    Past Surgical History:  Procedure Laterality Date   BREAST SURGERY     mass   CARPAL TUNNEL RELEASE Bilateral    ROTATOR CUFF REPAIR Bilateral    VASECTOMY     Family History  Problem Relation Age of Onset   Cancer Neg Hx    Social History   Socioeconomic History   Marital status: Married    Spouse name: Not on file   Number of children: Not on file   Years of education: Not on file   Highest education level: Not on file  Occupational History   Not on file  Tobacco Use   Smoking status: Never   Smokeless tobacco: Never  Substance and Sexual Activity   Alcohol use: Never   Drug use: Never   Sexual activity: Not on file  Other Topics Concern   Not on file  Social History Narrative   Not on file   Social Determinants of Health   Financial Resource Strain: Low Risk  (03/12/2021)   Overall Financial Resource Strain (CARDIA)    Difficulty of Paying Living Expenses: Not hard at all  Food Insecurity: No Food Insecurity (03/12/2021)   Hunger Vital Sign    Worried About Running Out of Food in the Last Year: Never true    [place] in  the Last Year: Never true  Transportation Needs: No Transportation Needs (03/12/2021)   PRAPARE - Hydrologist (Medical): No    Lack of Transportation (Non-Medical): No  Physical Activity: Inactive (03/12/2021)   Exercise Vital Sign    Days of Exercise per Week: 0 days    Minutes of Exercise per Session: 0 min  Stress: No Stress Concern Present (03/12/2021)   [place]    Feeling of Stress : Not at all  Social Connections: Moderately Isolated (03/12/2021)   Social Connection and Isolation Panel [NHANES]    Frequency of Communication with Friends and Family: More than three times a week    Frequency of Social Gatherings with Friends and Family: More than three times a week    Attends Religious Services: Never    Marine scientist or Organizations: No    Attends Music therapist: Never    Marital Status: Married    Tobacco Counseling Counseling given: Not Answered   Clinical Intake:  Pre-visit preparation completed: Yes  Pain : No/denies pain     How often do you need to have someone help you when you read instructions, pamphlets, or other written materials from your doctor or pharmacy?: 1 - Never  Diabetic? Yes Nutrition Risk Assessment:  Has the patient had any N/V/D within the last 2 months?  No  Does the patient have any non-healing wounds?  No  Has the patient had any unintentional weight loss or weight gain?  No   Diabetes:  Is the patient diabetic?  Yes  If diabetic, was a CBG obtained today?  No  Did the patient bring in their glucometer from home?   No, audio visit How often do you monitor your CBG's? never.   Financial Strains and Diabetes Management:  Are you having any financial strains with the device, your supplies or your medication? No .  Does the patient want to be seen by Chronic Care Management for management of their diabetes?  No  Would  the patient like to be referred to a Nutritionist or for Diabetic Management?  No   Diabetic Exams:  Diabetic Eye Exam: Completed 07/02/21 Diabetic Foot Exam: Overdue, Pt has been advised about the importance in completing this exam. Pt is scheduled for diabetic foot exam on N/a.   Interpreter Needed?: No  Information entered by :: Beatris Ship, [place] [facility]   Activities of Daily Living    03/14/2022    9:04 AM  In your present state of health, do you have any difficulty performing the following activities:  Hearing? 1  Comment wears hearing aids  Vision? 0  Difficulty concentrating or making decisions? 0  Walking  or climbing stairs? 0  Dressing or bathing? 0  Doing errands, shopping? 0  Preparing Food and eating ? N  Using the Toilet? N  In the past six months, have you accidently leaked urine? N  Do you have problems with loss of bowel control? N  Managing your Medications? N  Managing your Finances? N  Housekeeping or managing your Housekeeping? N    Patient Care Team: Shelda Pal, DO as PCP - General (Family Medicine)  Indicate any recent Medical Services you may have received from other than [facility] providers in the past year (date may be approximate).     Assessment:   This is a routine wellness examination for Kamil.  Hearing/Vision screen No results found.  Dietary issues and exercise activities discussed: Current Exercise Habits: The patient does not participate in regular exercise at present (does all the yard work), Exercise limited by: None identified   Goals Addressed   None    Depression Screen    03/14/2022    9:04 AM 12/24/2021    7:25 AM 03/12/2021    8:26 AM 09/19/2020    9:24 AM  PHQ 2/9 Scores  PHQ - 2 Score 0 0 0 0    Fall Risk    03/14/2022    9:19 AM 12/24/2021    7:25 AM 03/12/2021    8:25 AM  Fall Risk   Falls in the past year? 0 0 0  Number falls in past yr: 0 0 0  Injury with Fall? 0 0 0  Risk for fall due to : No Fall Risks No  Fall Risks   Follow up Falls evaluation completed Falls evaluation completed Falls prevention discussed    [place]:  Any stairs in or around the home? Yes  If so, are there any without handrails? No  Home free of loose throw rugs in walkways, pet beds, electrical cords, etc? Yes  Adequate lighting in your home to reduce risk of falls? Yes   ASSISTIVE DEVICES UTILIZED TO PREVENT FALLS:  Life alert? No  Use of a cane, walker or w/c? No  Grab bars in the bathroom? No  Shower chair or bench in shower? No  Elevated toilet seat or a handicapped toilet? Yes   TIMED UP AND GO:  Was the test performed? No .  Audio visit   Cognitive Function:        03/14/2022    9:10 AM  6CIT Screen  What Year? 0 points  What month? 0 points  What time? 0 points  Count back from 20 0 points  Months in reverse 0 points  Repeat phrase 0 points  Total Score 0 points    Immunizations Immunization History  Administered Date(s) Administered   Fluad Quad(high Dose 65+) 03/20/2020, 04/09/2021   Influenza, High Dose Seasonal PF 04/03/2016, 04/03/2017, 04/06/2018, 03/31/2019   Pneumococcal Conjugate-13 04/03/2016   Pneumococcal Polysaccharide-23 04/03/2017   Tdap 06/17/2012   Zoster Recombinat (Shingrix) 04/01/2019   Zoster, Live 06/18/2011    TDAP status: Up to date  Flu Vaccine status: Due, Education has been provided regarding the importance of this vaccine. Advised may receive this vaccine at local pharmacy or Health Dept. Aware to provide a copy of the vaccination record if obtained from local pharmacy or Health Dept. Verbalized acceptance and understanding.  Pneumococcal vaccine status: Up to date  Covid-19 vaccine status: Declined, Education has been provided regarding the importance of this vaccine but patient still declined. Advised  may receive this vaccine at local pharmacy or Health Dept.or vaccine clinic. Aware to provide a copy of the vaccination  record if obtained from local pharmacy or Health Dept. Verbalized acceptance and understanding.  Qualifies for Shingles Vaccine? Yes   Zostavax completed Yes   Shingrix Completed?: No.    Education has been provided regarding the importance of this vaccine. Patient has been advised to call insurance company to determine out of pocket expense if they have not yet received this vaccine. Advised may also receive vaccine at local pharmacy or Health Dept. Verbalized acceptance and understanding.  Screening Tests Health Maintenance  Topic Date Due   Zoster Vaccines- Shingrix (2 of 2) 05/27/2019   FOOT EXAM  10/27/2021   INFLUENZA VACCINE  01/15/2022   TETANUS/TDAP  06/17/2022   HEMOGLOBIN A1C  06/26/2022   OPHTHALMOLOGY EXAM  07/02/2022   Diabetic kidney evaluation - GFR measurement  12/25/2022   Diabetic kidney evaluation - Urine ACR  12/25/2022   COLONOSCOPY (Pts 45-35yr Insurance coverage will need to be confirmed)  11/30/2029   Pneumonia Vaccine 71 Years old  Completed   Hepatitis C Screening  Completed   HPV VACCINES  Aged Out   COVID-19 Vaccine  Discontinued    Health Maintenance  Health Maintenance Due  Topic Date Due   Zoster Vaccines- Shingrix (2 of 2) 05/27/2019   FOOT EXAM  10/27/2021   INFLUENZA VACCINE  01/15/2022    Colorectal cancer screening: Type of screening: Colonoscopy. Completed 12/01/19. Repeat every 10 years  Lung Cancer Screening: (Low Dose CT Chest recommended if Age 71-80years, 30 pack-year currently smoking OR have quit w/in 15years.) does not qualify.   Lung Cancer Screening Referral: N/a  Additional Screening:  Hepatitis C Screening: does qualify; Completed 09/19/20  Vision Screening: Recommended annual ophthalmology exams for early detection of glaucoma and other disorders of the eye. Is the patient up to date with their annual eye exam?  Yes  Who is the provider or what is the name of the office in which the patient attends annual eye exams? Dr.  WJeris PentaIf pt is not established with a provider, would they like to be referred to a provider to establish care? No .   Dental Screening: Recommended annual dental exams for proper oral hygiene  Community Resource Referral / Chronic Care Management: CRR required this visit?  No   CCM required this visit?  No      Plan:     I have personally reviewed and noted the following in the patient's chart:   Medical and social history Use of alcohol, tobacco or illicit drugs  Current medications and supplements including opioid prescriptions. Patient is not currently taking opioid prescriptions. Functional ability and status Nutritional status Physical activity Advanced directives List of other physicians Hospitalizations, surgeries, and ER visits in previous 12 months Vitals Screenings to include cognitive, depression, and falls Referrals and appointments  In addition, I have reviewed and discussed with patient certain preventive protocols, quality metrics, and best practice recommendations. A written personalized care plan for preventive services as well as general preventive health recommendations were provided to patient.   Due to this being a telephonic visit, the after visit summary with patients personalized plan was offered to patient via mail or my-chart. Patient would like to access on my-chart.  BBeatris Ship [place]  03/14/2022   Nurse Notes: None

## 2022-03-14 NOTE — Patient Instructions (Signed)
Todd Parks , Thank you for taking time to come for your Medicare Wellness Visit. I appreciate your ongoing commitment to your health goals. Please review the following plan we discussed and let me know if I can assist you in the future.   These are the goals we discussed:  Goals      Patient Stated     Maintain current health        This is a list of the screening recommended for you and due dates:  Health Maintenance  Topic Date Due   Zoster (Shingles) Vaccine (2 of 2) 05/27/2019   Complete foot exam   10/27/2021   Flu Shot  01/15/2022   Tetanus Vaccine  06/17/2022   Hemoglobin A1C  06/26/2022   Eye exam for diabetics  07/02/2022   Yearly kidney function blood test for diabetes  12/25/2022   Yearly kidney health urinalysis for diabetes  12/25/2022   Colon Cancer Screening  11/30/2029   Pneumonia Vaccine  Completed   Hepatitis C Screening: USPSTF Recommendation to screen - Ages 37-79 yo.  Completed   HPV Vaccine  Aged Out   COVID-19 Vaccine  Discontinued     Next appointment: Follow up in one year for your annual wellness visit.   Preventive Care 42 Years and Older, Male Preventive care refers to lifestyle choices and visits with your health care provider that can promote health and wellness. What does preventive care include? A yearly physical exam. This is also called an annual well check. Dental exams once or twice a year. Routine eye exams. Ask your health care provider how often you should have your eyes checked. Personal lifestyle choices, including: Daily care of your teeth and gums. Regular physical activity. Eating a healthy diet. Avoiding tobacco and drug use. Limiting alcohol use. Practicing safe sex. Taking low doses of aspirin every day. Taking vitamin and mineral supplements as recommended by your health care provider. What happens during an annual well check? The services and screenings done by your health care provider during your annual well check will  depend on your age, overall health, lifestyle risk factors, and family history of disease. Counseling  Your health care provider may ask you questions about your: Alcohol use. Tobacco use. Drug use. Emotional well-being. Home and relationship well-being. Sexual activity. Eating habits. History of falls. Memory and ability to understand (cognition). Work and work Statistician. Screening  You may have the following tests or measurements: Height, weight, and BMI. Blood pressure. Lipid and cholesterol levels. These may be checked every 5 years, or more frequently if you are over 28 years old. Skin check. Lung cancer screening. You may have this screening every year starting at age 65 if you have a 30-pack-year history of smoking and currently smoke or have quit within the past 15 years. Fecal occult blood test (FOBT) of the stool. You may have this test every year starting at age 30. Flexible sigmoidoscopy or colonoscopy. You may have a sigmoidoscopy every 5 years or a colonoscopy every 10 years starting at age 43. Prostate cancer screening. Recommendations will vary depending on your family history and other risks. Hepatitis C blood test. Hepatitis B blood test. Sexually transmitted disease (STD) testing. Diabetes screening. This is done by checking your blood sugar (glucose) after you have not eaten for a while (fasting). You may have this done every 1-3 years. Abdominal aortic aneurysm (AAA) screening. You may need this if you are a current or former smoker. Osteoporosis. You may be screened  starting at age 75 if you are at high risk. Talk with your health care provider about your test results, treatment options, and if necessary, the need for more tests. Vaccines  Your health care provider may recommend certain vaccines, such as: Influenza vaccine. This is recommended every year. Tetanus, diphtheria, and acellular pertussis (Tdap, Td) vaccine. You may need a Td booster every 10  years. Zoster vaccine. You may need this after age 36. Pneumococcal 13-valent conjugate (PCV13) vaccine. One dose is recommended after age 4. Pneumococcal polysaccharide (PPSV23) vaccine. One dose is recommended after age 32. Talk to your health care provider about which screenings and vaccines you need and how often you need them. This information is not intended to replace advice given to you by your health care provider. Make sure you discuss any questions you have with your health care provider. Document Released: 06/30/2015 Document Revised: 02/21/2016 Document Reviewed: 04/04/2015 Elsevier Interactive Patient Education  2017 [place] Prevention in the Home Falls can cause injuries. They can happen to people of all ages. There are many things you can do to make your home safe and to help prevent falls. What can I do on the outside of my home? Regularly fix the edges of walkways and driveways and fix any cracks. Remove anything that might make you trip as you walk through a door, such as a raised step or threshold. Trim any bushes or trees on the path to your home. Use bright outdoor lighting. Clear any walking paths of anything that might make someone trip, such as rocks or tools. Regularly check to see if handrails are loose or broken. Make sure that both sides of any steps have handrails. Any raised decks and porches should have guardrails on the edges. Have any leaves, snow, or ice cleared regularly. Use sand or salt on walking paths during winter. Clean up any spills in your garage right away. This includes oil or grease spills. What can I do in the bathroom? Use night lights. Install grab bars by the toilet and in the tub and shower. Do not use towel bars as grab bars. Use non-skid mats or decals in the tub or shower. If you need to sit down in the shower, use a plastic, non-slip stool. Keep the floor dry. Clean up any water that spills on the floor as soon as it  happens. Remove soap buildup in the tub or shower regularly. Attach bath mats securely with double-sided non-slip rug tape. Do not have throw rugs and other things on the floor that can make you trip. What can I do in the bedroom? Use night lights. Make sure that you have a light by your bed that is easy to reach. Do not use any sheets or blankets that are too big for your bed. They should not hang down onto the floor. Have a firm chair that has side arms. You can use this for support while you get dressed. Do not have throw rugs and other things on the floor that can make you trip. What can I do in the kitchen? Clean up any spills right away. Avoid walking on wet floors. Keep items that you use a lot in easy-to-reach places. If you need to reach something above you, use a strong step stool that has a grab bar. Keep electrical cords out of the way. Do not use floor polish or wax that makes floors slippery. If you must use wax, use non-skid floor wax. Do not have throw  rugs and other things on the floor that can make you trip. What can I do with my stairs? Do not leave any items on the stairs. Make sure that there are handrails on both sides of the stairs and use them. Fix handrails that are broken or loose. Make sure that handrails are as long as the stairways. Check any carpeting to make sure that it is firmly attached to the stairs. Fix any carpet that is loose or worn. Avoid having throw rugs at the top or bottom of the stairs. If you do have throw rugs, attach them to the floor with carpet tape. Make sure that you have a light switch at the top of the stairs and the bottom of the stairs. If you do not have them, ask someone to add them for you. What else can I do to help prevent falls? Wear shoes that: Do not have high heels. Have rubber bottoms. Are comfortable and fit you well. Are closed at the toe. Do not wear sandals. If you use a stepladder: Make sure that it is fully opened.  Do not climb a closed stepladder. Make sure that both sides of the stepladder are locked into place. Ask someone to hold it for you, if possible. Clearly mark and make sure that you can see: Any grab bars or handrails. First and last steps. Where the edge of each step is. Use tools that help you move around (mobility aids) if they are needed. These include: Canes. Walkers. Scooters. Crutches. Turn on the lights when you go into a dark area. Replace any light bulbs as soon as they burn out. Set up your furniture so you have a clear path. Avoid moving your furniture around. If any of your floors are uneven, fix them. If there are any pets around you, be aware of where they are. Review your medicines with your doctor. Some medicines can make you feel dizzy. This can increase your chance of falling. Ask your doctor what other things that you can do to help prevent falls. This information is not intended to replace advice given to you by your health care provider. Make sure you discuss any questions you have with your health care provider. Document Released: 03/30/2009 Document Revised: 11/09/2015 Document Reviewed: 07/08/2014 Elsevier Interactive Patient Education  2017 [employer].

## 2022-04-19 ENCOUNTER — Other Ambulatory Visit: Payer: Self-pay | Admitting: Family Medicine

## 2022-05-10 ENCOUNTER — Other Ambulatory Visit: Payer: Self-pay | Admitting: Family Medicine

## 2022-05-16 ENCOUNTER — Ambulatory Visit: Payer: Medicare PPO | Admitting: Podiatry

## 2022-05-16 DIAGNOSIS — M79675 Pain in left toe(s): Secondary | ICD-10-CM | POA: Diagnosis not present

## 2022-05-16 DIAGNOSIS — B351 Tinea unguium: Secondary | ICD-10-CM | POA: Diagnosis not present

## 2022-05-16 DIAGNOSIS — M79674 Pain in right toe(s): Secondary | ICD-10-CM | POA: Diagnosis not present

## 2022-05-16 DIAGNOSIS — E119 Type 2 diabetes mellitus without complications: Secondary | ICD-10-CM | POA: Diagnosis not present

## 2022-05-19 NOTE — Progress Notes (Signed)
Subjective: Chief Complaint  Patient presents with   Diabetes    iabetic foot care, A1c-  7.1 BG-not taking, Nail trim      71 y.o. returns the office today for painful, elongated, thickened toenails which he cannot trim himself.  He has no other concerns.  Otherwise he states his feet have been doing great.  Sharlene Dory, DO  A1c: 6.7 on 12/24/2021  Objective: AAO 3, NAD DP/PT pulses palpable, CRT less than 3 seconds Protective sensation decreased with Simms Weinstein monofilament Nails hypertrophic, dystrophic, elongated, brittle, discolored 10. There is tenderness overlying the nails 1-5 bilaterally. There is no surrounding erythema or drainage along the nail sites. Dry skin present without any open lesions. No pain with calf compression, swelling, warmth, erythema.  Assessment: Patient presents with symptomatic onychomycosis  Plan: -Treatment options including alternatives, risks, complications were discussed -Nails sharply debrided 10 without complication/bleeding. -Discussed daily foot inspection. If there are any changes, to call the office immediately. Continue with supportive shoes. -Follow-up in 3 months or sooner if any problems are to arise. In the meantime, encouraged to call the office with any questions, concerns, changes symptoms.  Ovid Curd, DPM

## 2022-06-11 DIAGNOSIS — L821 Other seborrheic keratosis: Secondary | ICD-10-CM | POA: Diagnosis not present

## 2022-06-11 DIAGNOSIS — Z85828 Personal history of other malignant neoplasm of skin: Secondary | ICD-10-CM | POA: Diagnosis not present

## 2022-06-11 DIAGNOSIS — L814 Other melanin hyperpigmentation: Secondary | ICD-10-CM | POA: Diagnosis not present

## 2022-06-11 DIAGNOSIS — L719 Rosacea, unspecified: Secondary | ICD-10-CM | POA: Diagnosis not present

## 2022-06-11 DIAGNOSIS — L579 Skin changes due to chronic exposure to nonionizing radiation, unspecified: Secondary | ICD-10-CM | POA: Diagnosis not present

## 2022-06-26 ENCOUNTER — Telehealth: Payer: Self-pay

## 2022-06-26 NOTE — Telephone Encounter (Signed)
PA form received from [facility]- form completed and faxed back to [phone]. Awaiting determination.

## 2022-06-27 ENCOUNTER — Other Ambulatory Visit: Payer: Self-pay | Admitting: Family Medicine

## 2022-06-27 MED ORDER — LANTUS SOLOSTAR 100 UNIT/ML ~~LOC~~ SOPN: 35.0000 [IU] | PEN_INJECTOR | Freq: Every day | SUBCUTANEOUS | 5 refills | Status: DC

## 2022-06-27 NOTE — Telephone Encounter (Signed)
Called left message to call back 

## 2022-06-27 NOTE — Telephone Encounter (Signed)
Received denial under Medicare Part D benefit plan. Preferred insulin tried/failed  Lantus, Lantus solostar or Toujeo and [place]. This is from [facility] coverage policy

## 2022-06-27 NOTE — Telephone Encounter (Signed)
harmacy comment: Alternative Requested:THE PRESCRIBED MEDICATION IS NOT COVERED BY INSURANCE. PLEASE CONSIDER CHANGING TO ONE OF THE SUGGESTED COVERED ALTERNATIVES OR SUBMITTING FOR A PRIOR AUTHORIZATION.   All Pharmacy Suggested Alternatives:  insulin glargine (LANTUS SOLOSTAR) 100 UNIT/ML Solostar Pen insulin glargine, 1 Unit Dial, (TOUJEO SOLOSTAR) 300 UNIT/ML Solostar Pen insulin degludec (TRESIBA FLEXTOUCH) 100 UNIT/ML FlexTouch Pen

## 2022-06-27 NOTE — Telephone Encounter (Signed)
Lantus sent. Plz inform pt. Ty.

## 2022-06-28 ENCOUNTER — Other Ambulatory Visit: Payer: Self-pay

## 2022-06-28 MED ORDER — TRESIBA FLEXTOUCH 100 UNIT/ML ~~LOC~~ SOPN: 35.0000 [IU] | PEN_INJECTOR | Freq: Every day | SUBCUTANEOUS | 2 refills | Status: DC

## 2022-07-01 ENCOUNTER — Encounter: Payer: Self-pay | Admitting: Family Medicine

## 2022-07-01 ENCOUNTER — Ambulatory Visit (INDEPENDENT_AMBULATORY_CARE_PROVIDER_SITE_OTHER): Payer: Medicare PPO | Admitting: Family Medicine

## 2022-07-01 VITALS — BP 110/62 | HR 68 | Temp 97.7°F | Ht 68.0 in | Wt 198.0 lb

## 2022-07-01 DIAGNOSIS — Z0000 Encounter for general adult medical examination without abnormal findings: Secondary | ICD-10-CM | POA: Diagnosis not present

## 2022-07-01 DIAGNOSIS — Z23 Encounter for immunization: Secondary | ICD-10-CM

## 2022-07-01 DIAGNOSIS — E1169 Type 2 diabetes mellitus with other specified complication: Secondary | ICD-10-CM

## 2022-07-01 DIAGNOSIS — E669 Obesity, unspecified: Secondary | ICD-10-CM | POA: Diagnosis not present

## 2022-07-01 LAB — CBC
HCT: 41.7 % (ref 39.0–52.0)
Hemoglobin: 14 g/dL (ref 13.0–17.0)
MCHC: 33.6 g/dL (ref 30.0–36.0)
MCV: 87.5 fl (ref 78.0–100.0)
Platelets: 202 10*3/uL (ref 150.0–400.0)
RBC: 4.77 Mil/uL (ref 4.22–5.81)
RDW: 13.5 % (ref 11.5–15.5)
WBC: 5.5 10*3/uL (ref 4.0–10.5)

## 2022-07-01 LAB — HEMOGLOBIN A1C: Hgb A1c MFr Bld: 6.7 % — ABNORMAL HIGH (ref 4.6–6.5)

## 2022-07-01 LAB — COMPREHENSIVE METABOLIC PANEL
ALT: 20 U/L (ref 0–53)
AST: 24 U/L (ref 0–37)
Albumin: 4.5 g/dL (ref 3.5–5.2)
Alkaline Phosphatase: 32 U/L — ABNORMAL LOW (ref 39–117)
BUN: 17 mg/dL (ref 6–23)
CO2: 29 mEq/L (ref 19–32)
Calcium: 9.1 mg/dL (ref 8.4–10.5)
Chloride: 104 mEq/L (ref 96–112)
Creatinine, Ser: 1.46 mg/dL (ref 0.40–1.50)
GFR: 47.99 mL/min — ABNORMAL LOW (ref >60.00)
Glucose, Bld: 94 mg/dL (ref 70–99)
Potassium: 4.2 mEq/L (ref 3.5–5.1)
Sodium: 141 mEq/L (ref 135–145)
Total Bilirubin: 0.7 mg/dL (ref 0.2–1.2)
Total Protein: 6.7 g/dL (ref 6.0–8.3)

## 2022-07-01 LAB — LIPID PANEL
Cholesterol: 118 mg/dL (ref 0–200)
HDL: 28.7 mg/dL — ABNORMAL LOW (ref >39.00)
LDL Cholesterol: 58 mg/dL (ref 0–99)
NonHDL: 89.18
Total CHOL/HDL Ratio: 4
Triglycerides: 157 mg/dL — ABNORMAL HIGH (ref 0.0–149.0)
VLDL: 31.4 mg/dL (ref 0.0–40.0)

## 2022-07-01 NOTE — Patient Instructions (Addendum)
Give [place] 2-3 business days to get the results of your labs back.   Keep the diet clean and stay active.  Aim to do some physical exertion for 150 minutes per week. This is typically divided into 5 days per week, 30 minutes per day. The activity should be enough to get your heart rate up. Anything is better than nothing if you have time constraints.  Please inquire about the tetanus shot at the pharmacy. Also ask them what dates you had your shingles vaccines as well.   Please get me a copy of your advanced directive form at your convenience.   Let us know if you need anything.

## 2022-07-01 NOTE — Progress Notes (Signed)
Chief Complaint  Patient presents with   Annual Exam    Well Male Todd Parks is here for a complete physical.   His last physical was >1 year ago.  Current diet: in general, diet could be better.   Current exercise: tries to stay active Weight trend: stable Fatigue out of ordinary? No. Seat belt? Yes.   Advanced directive? Yes  Health maintenance Shingrix- Yes (reportedly) Colonoscopy- Yes Tetanus- Due Hep C- Yes Pneumonia vaccine- Yes  Past Medical History:  Diagnosis Date   Diabetes mellitus without complication (HCC)    GERD (gastroesophageal reflux disease)    History of heart attack 2006   Hyperlipidemia    Hypertension      Past Surgical History:  Procedure Laterality Date   BREAST SURGERY     mass   CARPAL TUNNEL RELEASE Bilateral    ROTATOR CUFF REPAIR Bilateral    VASECTOMY      Medications  Current Outpatient Medications on File Prior to Visit  Medication Sig Dispense Refill   ACCU-CHEK AVIVA PLUS test strip Use daily to check blood sugar.  DXE11.9 100 each 3   Accu-Chek Softclix Lancets lancets Use daily to check blood sugar..  DX E11.9 100 each 3   aspirin EC 81 MG tablet Take by mouth.     BD PEN NEEDLE NANO 2ND GEN 32G X 4 MM MISC USE ONCE DAILY WITH LEVEMIR PEN 100 each 1   Blood Glucose Monitoring Suppl (FIFTY50 GLUCOSE METER 2.0) w/Device KIT Use as instructed     Cholecalciferol (VITAMIN D3) 50 MCG (2000 UT) capsule      Clobetasol Propionate 0.05 % shampoo APPLY TO AFFECTED AREA EVERY DAY  3   fenofibrate (TRICOR) 145 MG tablet Take 1 tablet by mouth daily.     INVOKANA 300 MG TABS tablet TAKE 1 TABLET BY MOUTH EVERY DAY 90 tablet 2   metFORMIN (GLUCOPHAGE) 1000 MG tablet TAKE 1 TABLET (1,000 MG TOTAL) BY MOUTH TWICE A DAY WITH MEALS 180 tablet 1   metoprolol succinate (TOPROL-XL) 100 MG 24 hr tablet TAKE 1 TABLET BY MOUTH EVERY DAY 90 tablet 3   nitroGLYCERIN (NITROSTAT) 0.4 MG SL tablet Place under the tongue.     omeprazole (PRILOSEC) 20 MG  capsule TAKE 1 CAPSULE BY MOUTH EVERY DAY 90 capsule 2   rosuvastatin (CRESTOR) 20 MG tablet Take 1 tablet (20 mg total) by mouth daily. 90 tablet 3   TRESIBA FLEXTOUCH 100 UNIT/ML FlexTouch Pen Inject 35 Units into the skin daily. 10.5 mL 2   valsartan (DIOVAN) 160 MG tablet TAKE 1 TABLET BY MOUTH EVERY DAY 90 tablet 2    Allergies No Known Allergies  Family History Family History  Problem Relation Age of Onset   Cancer Neg Hx     Review of Systems: Constitutional:  no fevers Eye:  no recent significant change in vision Ears:  No changes in hearing Nose/Mouth/Throat:  no complaints of nasal congestion, no sore throat Cardiovascular: no chest pain Respiratory:  No shortness of breath Gastrointestinal:  No change in bowel habits GU:  No frequency Integumentary:  no abnormal skin lesions reported Neurologic:  no headaches Endocrine:  denies unexplained weight changes  Exam BP 110/62 (BP Location: Left Arm, Patient Position: Sitting, Cuff Size: Normal)   Pulse 68   Temp 97.7 F (36.5 C) (Oral)   Ht 5\' 8"  (1.727 m)   Wt 198 lb (89.8 kg)   SpO2 98%   BMI 30.11 kg/m  General:  well  developed, well nourished, in no apparent distress Skin:  no significant moles, warts, or growths Head:  no masses, lesions, or tenderness Eyes:  pupils equal and round, sclera anicteric without injection Ears:  canals without lesions, TMs shiny without retraction, no obvious effusion, no erythema Nose:  nares patent, mucosa normal Throat/Pharynx:  lips and gingiva without lesion; tongue and uvula midline; non-inflamed pharynx; no exudates or postnasal drainage Lungs:  clear to auscultation, breath sounds equal bilaterally, no respiratory distress Cardio:  regular rate and rhythm, no LE edema or bruits Rectal: Deferred GI: BS+, S, NT, ND, no masses or organomegaly Musculoskeletal:  symmetrical muscle groups noted without atrophy or deformity Neuro: sensation intact to pinprick over b/l feet;  gait normal; deep tendon reflexes normal and symmetric Psych: well oriented with normal range of affect and appropriate judgment/insight  Assessment and Plan  Well adult exam  Diabetes mellitus type 2 in obese ([place]) - Plan: CBC, Comprehensive metabolic panel, Lipid panel, Hemoglobin A1c   Well 72 y.o. male. Counseled on diet and exercise. Advanced directive form requested today. Flu shot today. Inquire about Tdap at pharmacy. Reports having had the Shingrix, he will get the dates from his pharmacy.   Other orders as above. Follow up in 6 mo.  The patient voiced understanding and agreement to the plan.  [place], DO 07/01/22 9:08 AM

## 2022-07-03 ENCOUNTER — Ambulatory Visit: Payer: Medicare PPO | Admitting: Family Medicine

## 2022-07-03 ENCOUNTER — Telehealth: Payer: Self-pay

## 2022-07-03 ENCOUNTER — Encounter: Payer: Self-pay | Admitting: Family Medicine

## 2022-07-03 VITALS — BP 110/64 | HR 65 | Temp 99.7°F | Ht 68.0 in | Wt 198.0 lb

## 2022-07-03 DIAGNOSIS — J069 Acute upper respiratory infection, unspecified: Secondary | ICD-10-CM | POA: Diagnosis not present

## 2022-07-03 MED ORDER — HYDROCODONE BIT-HOMATROP MBR 5-1.5 MG/5ML PO SOLN: 5.0000 mL | Freq: Three times a day (TID) | ORAL | 0 refills | Status: DC | PRN

## 2022-07-03 NOTE — Patient Instructions (Addendum)
OK to take Tylenol 1000 mg (2 extra strength tabs) or 975 mg (3 regular strength tabs) every 6 hours as needed.  Continue to push fluids, practice good hand hygiene, and cover your mouth if you cough.  If you start having fevers, shaking or shortness of breath, seek immediate care.  Do not drink alcohol, do any illicit/street drugs, drive or do anything that requires alertness while on this medicine.   Send me a message early next week if no improvement.   Let us know if you need anything.

## 2022-07-03 NOTE — Telephone Encounter (Signed)
Appt w/ PCP today,

## 2022-07-03 NOTE — Progress Notes (Signed)
Chief Complaint  Patient presents with   Nasal Congestion    Todd Parks here for URI complaints.  Duration: 2 days  Associated symptoms: sinus congestion, sinus pain, rhinorrhea, itchy watery eyes, sore throat, and productive cough Denies: ear fullness, ear pain, ear drainage, wheezing, shortness of breath, myalgia, and fevers Treatment to date: allergy meds, INCS Sick contacts: Yes; family member and friends have been  Past Medical History:  Diagnosis Date   Diabetes mellitus without complication ([place])    GERD (gastroesophageal reflux disease)    History of heart attack 2006   Hyperlipidemia    Hypertension     Objective BP 110/64 (BP Location: Left Arm, Patient Position: Sitting, Cuff Size: Normal)   Pulse 65   Temp 99.7 F (37.6 C) (Oral)   Ht 5\' 8"  (1.727 m)   Wt 198 lb (89.8 kg)   SpO2 93%   BMI 30.11 kg/m  General: Awake, alert, appears stated age HEENT: AT, [place], ears patent b/l and TM's neg, nares patent w/o discharge, pharynx pink and without exudates, MMM Neck: No masses or asymmetry Heart: RRR Lungs: CTAB, no accessory muscle use Psych: Age appropriate judgment and insight, normal mood and affect  Viral URI with cough - Plan: HYDROcodone bit-homatropine (HYCODAN) 5-1.5 MG/5ML syrup, COVID-19, Flu A+B and RSV  Syrup as above. Warnings verbalized and written down. Continue to push fluids, practice good hand hygiene, cover mouth when coughing. F/u prn. If starting to experience fevers, shaking, or shortness of breath, seek immediate care. Pt voiced understanding and agreement to the plan.  [place], DO 07/03/22 2:10 PM

## 2022-07-03 NOTE — Telephone Encounter (Signed)
Nurse Assessment Nurse: Larene Pickett RN, Marcie Bal Date/Time Eilene Ghazi Time): 07/02/2022 6:08:28 PM Confirm and document reason for call. If symptomatic, describe symptoms. ---Caller states her husband has a sore throat and low tactile fever that started this morning. Congestion and drainage has worsened as the day has gone on. His son has had strep throat recently and caller was around him. Does the patient have any new or worsening symptoms? ---Yes Will a triage be completed? ---Yes Related visit to physician within the last 2 weeks? ---No Does the PT have any chronic conditions? (i.e. diabetes, asthma, this includes High risk factors for pregnancy, etc.) ---Yes List chronic conditions. ---HTN Heart attack in past Type II DBT Is this a behavioral health or substance abuse call? ---No Guidelines Guideline Title Affirmed Question Affirmed Notes Nurse Date/Time (Eastern Time) Immunization Reactions Influenza (TIV; Injection) injected vaccine reactions Larene Pickett, RN, Marcie Bal 07/02/2022 6:12:05 PM Cough - Acute Productive Cough with cold symptoms (e.g., Larene Pickett, RN, Marcie Bal 07/02/2022 6:16:22 PM PLEASE NOTE: All timestamps contained within this report are represented as [place] Standard Time. CONFIDENTIALTY NOTICE: This fax transmission is intended only for the addressee. It contains information that is legally privileged, confidential or otherwise protected from use or disclosure. If you are not the intended recipient, you are strictly prohibited from reviewing, disclosing, copying using or disseminating any of this information or taking any action in reliance on or regarding this information. If you have received this fax in error, please notify us immediately by telephone so that we can arrange for its return to [place]. Phone: [phone], Toll-Free: [phone], Fax: [phone] Page: 2 of 2 Call Id: [id number] Guidelines Guideline Title Affirmed Question Affirmed Notes Nurse Date/Time  Eilene Ghazi Time) runny nose, postnasal drip, throat clearing) Disp. Time Eilene Ghazi Time) Disposition Final User 07/02/2022 6:15:42 PM Home Care Bobby Rumpf 07/02/2022 6:23:21 PM Home Care Yes Larene Pickett, RN, Marcie Bal Final Disposition 07/02/2022 6:23:21 PM Home Care Yes Larene Pickett, RN, Lenon Oms Disagree/Comply Comply Caller Understands Yes PreDisposition Home Care Care Advice Given Per Guideline INFLUENZA VIRUS VACCINE (TIV; INJECTED) - COMMON REACTIONS: * Local pain at injection site * Fever * Aches * If these symptoms occur, they usually last 1 to 2 days. YOU CANNOT GET THE FLU FROM THE VACCINE: * Since the virus in the vaccine has been killed, you cannot get influenza from the vaccine. PAIN AND FEVER MEDICINES: CALL BACK IF: * Fever lasts over 3 days * Pain lasts over 3 days * Redness or swelling lasts over 3 days * You become worse CARE ADVICE given per Immunization Reactions (Adult) guideline. HOME CARE: * You should be able to treat this at home. REASSURANCE AND EDUCATION - COUGH: * It doesn't sound like a serious cough. COUGH MEDICINES: * HOME REMEDY - HONEY: This old home remedy has been shown to help decrease coughing at night. The adult dosage is 2 teaspoons (10 ml) at bedtime. HUMIDIFIER: * If the air is dry, use a humidifier in the bedroom. * Dry air makes coughs worse. DRINK PLENTY OF LIQUIDS: * Drink plenty of liquids. * Staying well-hydrated will help loosen phlegm. * A viral upper respiratory infection (such as a cold or flu) causes a cough for 1 to 3 weeks. EXPECTED COURSE: CALL BACK IF: * Sometimes you may cough up phlegm (mucus). The mucus can normally be white, gray, yellow or green for the first couple days. * Cough lasts over 3 weeks * Continuous coughing persists over 2 hours after cough treatment * Difficulty breathing occurs * Fever over 103  F (39.4 C) * Fever lasts over 3 days * You become worse Comments User: Lynnell Catalan, RN Date/Time Eilene Ghazi Time): 07/02/2022 6:12:35  PM Took flu shot and tetanus yesterday

## 2022-07-04 ENCOUNTER — Telehealth: Payer: Self-pay | Admitting: Family Medicine

## 2022-07-04 MED ORDER — PROMETHAZINE-DM 6.25-15 MG/5ML PO SYRP: 5.0000 mL | ORAL_SOLUTION | Freq: Four times a day (QID) | ORAL | 0 refills | Status: DC | PRN

## 2022-07-04 NOTE — Telephone Encounter (Signed)
Patient called to advise that the HYDROcodone bit-homatropine (HYCODAN) 5-1.5 MG/5ML syrup  Is on backorder and something else needs to be called in for him to [facility] on [address]

## 2022-07-04 NOTE — Telephone Encounter (Signed)
Spoke w/ Pt- informed that alternative med sent to pharmacy. Pt verbalized understanding.

## 2022-07-04 NOTE — Telephone Encounter (Signed)
Sent!

## 2022-07-05 ENCOUNTER — Other Ambulatory Visit: Payer: Self-pay | Admitting: Family Medicine

## 2022-07-05 LAB — COVID-19, FLU A+B AND RSV
Influenza A, NAA: NOT DETECTED
Influenza B, NAA: NOT DETECTED
RSV, NAA: NOT DETECTED
SARS-CoV-2, NAA: DETECTED — AB

## 2022-07-05 MED ORDER — NIRMATRELVIR/RITONAVIR (PAXLOVID) TABLET (RENAL DOSING): 2.0000 | ORAL_TABLET | Freq: Two times a day (BID) | ORAL | 0 refills | Status: AC

## 2022-07-12 ENCOUNTER — Other Ambulatory Visit: Payer: Self-pay | Admitting: Family Medicine

## 2022-07-23 ENCOUNTER — Other Ambulatory Visit: Payer: Self-pay

## 2022-07-23 IMAGING — MR MR LUMBAR SPINE W/O CM
4 of 7 series · 16 of 48 positions shown · non-contrast
Comparison: Radiographs of the lumbar spine 01/08/2019.

CLINICAL DATA: Lumbar spondylosis. Additional history provided by
scanning technologist: Patient reports low back pain for 2 years.

EXAM:
MRI LUMBAR SPINE WITHOUT CONTRAST
TECHNIQUE: Multiplanar, multisequence MR imaging of the lumbar spine was
performed. No intravenous contrast was administered.

[Series 9: T2 · sagittal · 4.0mm · 0.62mm/px · 5 of 17 slices shown (1 of 2)]
[im 1/17]
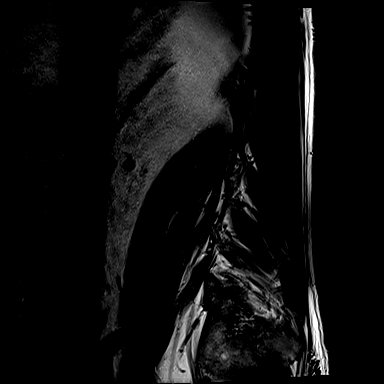
[im 5/17]
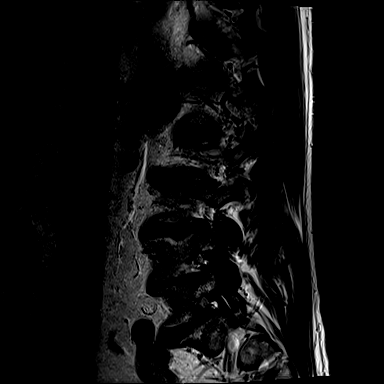
[im 9/17]
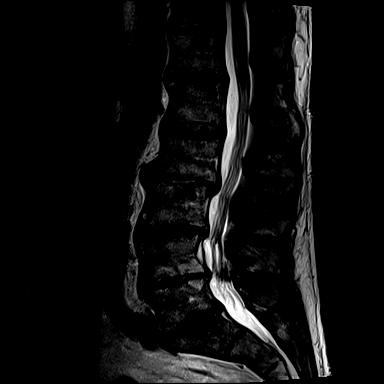
[im 13/17]
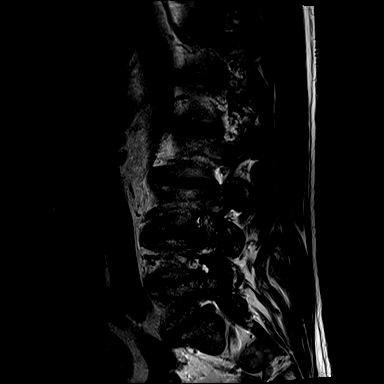
[im 17/17]
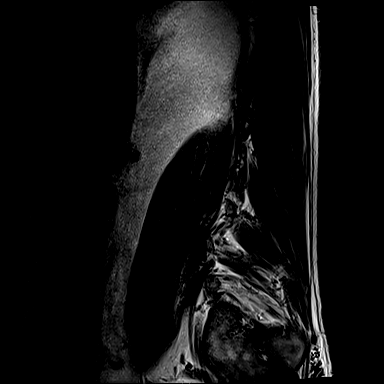

[Series 10: T1 · sagittal · 4.0mm · 0.73mm/px · 3 of 17 slices shown (1 of 2)]
[im 1/17]
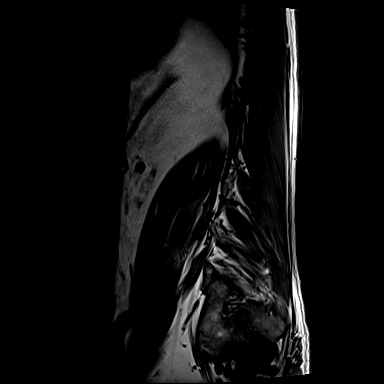
[im 9/17]
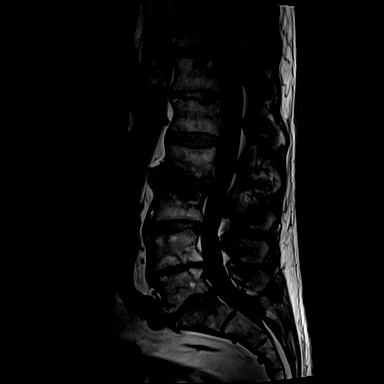
[im 17/17]
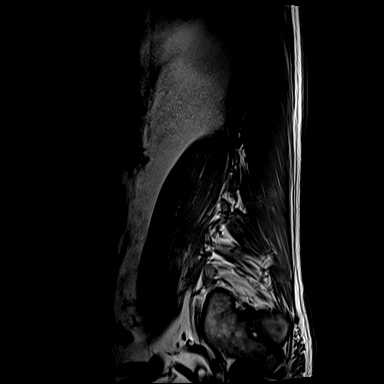

[Series 12: T1 · axial · 4.0mm · 0.28mm/px · z∈[+115,+205]mm · 3 of 19 slices shown (2 of 2)]
[im 1/19]
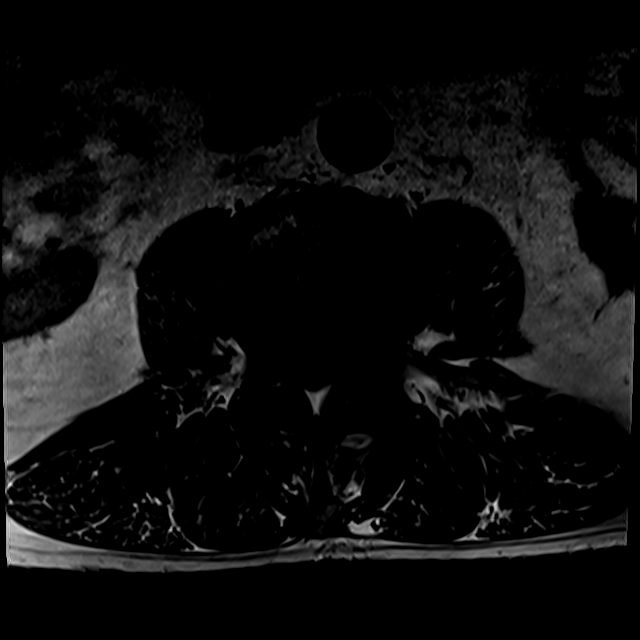
[im 10/19]
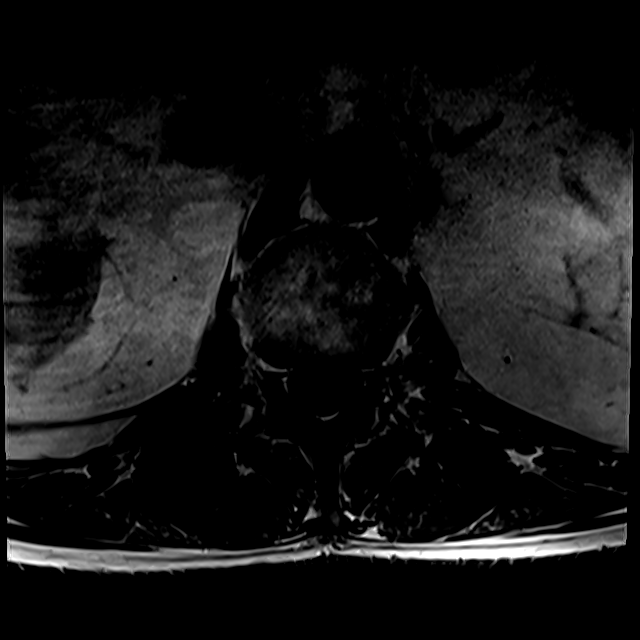
[im 19/19]
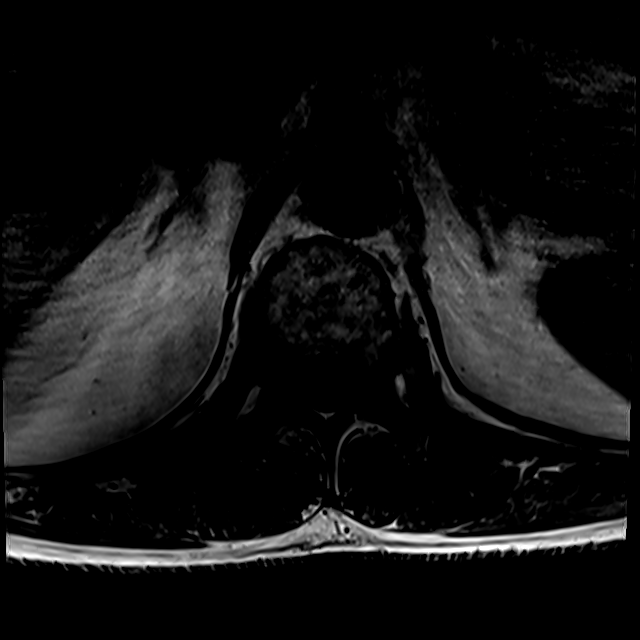

[Series 16: T2 · axial · 4.0mm · 0.28mm/px · z∈[+1,+185]mm · 5 of 39 slices shown (2 of 2)]
[im 1/39]
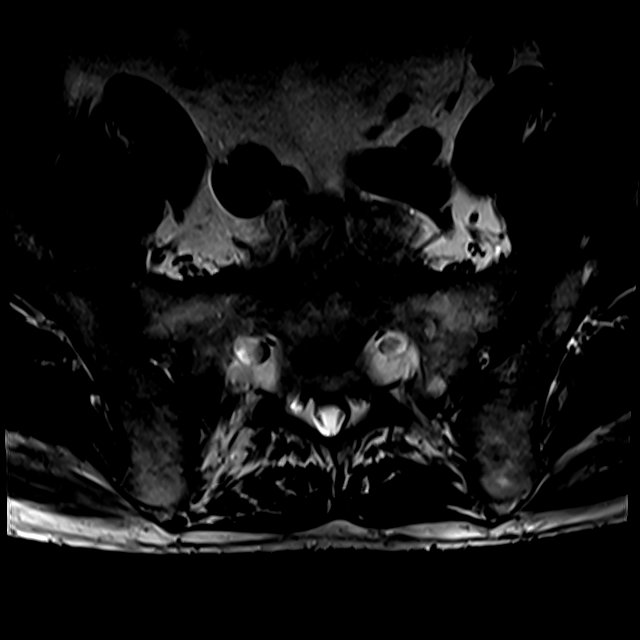
[im 4/39]
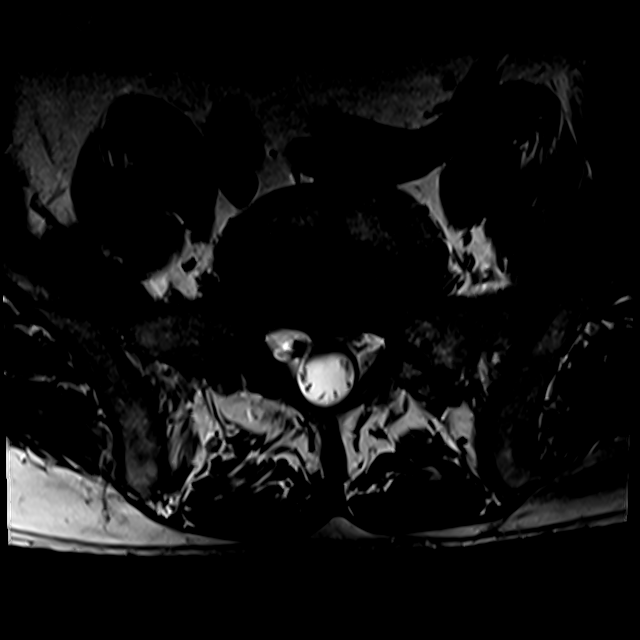
[im 8/39]
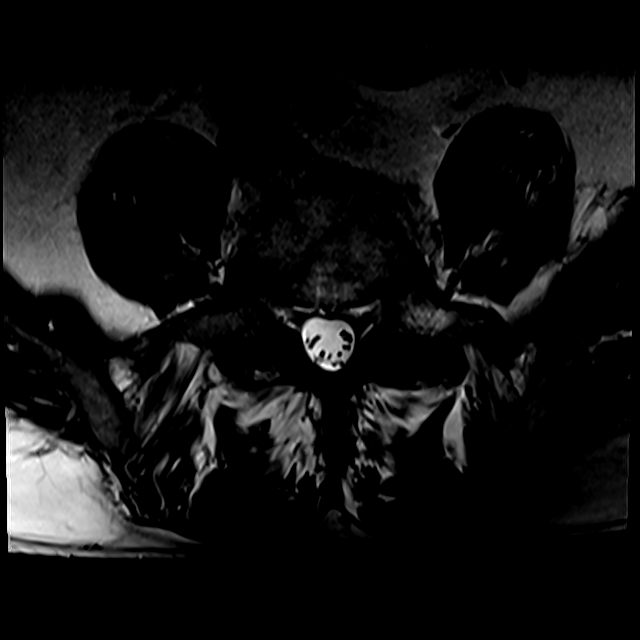
[im 20/39]
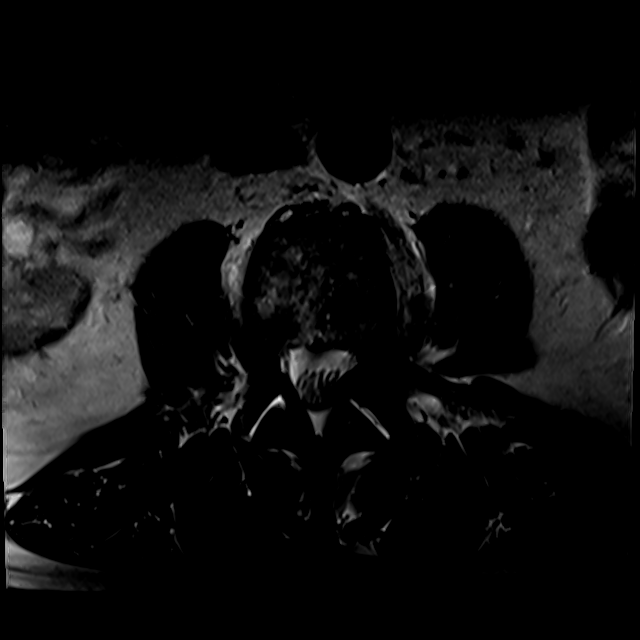
[im 35/39]
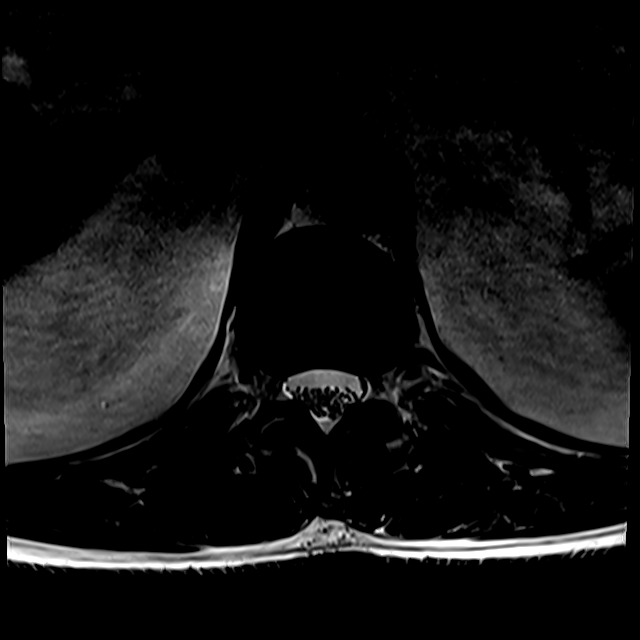

[16 of 48 positions shown; findings below may reference images not displayed]

FINDINGS: Segmentation: 5 lumbar vertebrae. The caudal most well-formed
interval disc space is designated L5-S1.

Alignment: Lumbar levocurvature. Trace T12-L1 grade 1
anterolisthesis. Trace L1-L2 grade 1 retrolisthesis. 2 mm L2-L3 and
L3-L4 grade 1 retrolisthesis.

Vertebrae: Multilevel degenerative endplate irregularity with small
Schmorl nodes. Moderate edema within the L2 and L3 vertebrae,
abutting the endplates. Mild degenerative endplate edema at L3-L4,
L4-L5 and L5-S1. Fatty degenerative endplate marrow signal also
present at multiple levels. Most notably, moderate fatty
degenerative endplate marrow signal is present at L4-L5. Multilevel
ventral osteophytes

Conus medullaris and cauda equina: Conus extends to the L1 level. No
signal abnormality within the visualized distal spinal cord.

Paraspinal and other soft tissues: No abnormality identified within
included portions of the abdomen/retroperitoneum. Paraspinal soft
tissues within normal limits.

Disc levels:

Multilevel disc degeneration, greatest at L2-L3 (moderate/advanced),
L3-L4 (moderate), L4-L5 (moderate/advanced) and L5-S1
(moderate/advanced).

T11-T12: Imaged sagittally. Shallow disc bulge. No significant
spinal canal or foraminal stenosis.

T12-L1: Trace grade 1 anterolisthesis. Mild disc uncovering and disc
bulge. No significant spinal canal or foraminal stenosis.

L1-L2: Small disc bulge. Minimal facet arthrosis. No significant
spinal canal or foraminal stenosis.

L2-L3: Grade 1 retrolisthesis. Disc bulge asymmetric to the left
with associated endplate spurring. Facet arthrosis. Trace fluid
within the bilateral facet joints. Bilateral subarticular stenosis
(mild right, moderate left). Mild central canal stenosis. Mild
relative right neural foraminal narrowing. Moderate left neural
foraminal narrowing.

L3-L4: Grade 1 retrolisthesis. Disc bulge with endplate spurring.
Facet arthrosis/ligamentum flavum hypertrophy. Mild right greater
than left subarticular narrowing without frank nerve root
impingement. Mild relative narrowing of the central canal. Bilateral
neural foraminal narrowing (moderate/severe right, moderate left).

L4-L5: Disc bulge with circumferential osteophyte ridge. Moderate
facet arthrosis with ligamentum flavum hypertrophy. Moderate
bilateral subarticular stenosis with slight crowding of the
descending L5 nerve roots (series 16, image 30). Mild/moderate
central canal stenosis. Moderate bilateral neural foraminal
narrowing.

L5-S1: Disc bulge with circumferential endplate spurring/osteophyte
ridge. Mild/moderate facet arthrosis. Trace fluid within the left
facet joint. Mild bilateral subarticular narrowing without
appreciable nerve root impingement. Central canal patent.
Moderate/severe bilateral neural foraminal narrowing.
IMPRESSION: Lumbar spondylosis, as outlined and with findings most notably as
follows.

At L2-L3, there is moderate/severe disc degeneration (particularly
on the left). Moderate edema within the L2 and L3 vertebrae along
the endplates is likely degenerative. Multifactorial bilateral
subarticular narrowing (mild right, moderate left) with a disc bulge
and endplate spurring posteriorly displacing the descending left L3
nerve root. Mild central canal stenosis. Bilateral neural foraminal
narrowing (mild right, moderate left).

At L3-L4, there is moderate disc degeneration. Multifactorial mild
bilateral subarticular and central canal narrowing. Bilateral neural
foraminal narrowing (moderate/severe right, moderate left).

At L4-L5, there is moderate/advanced disc degeneration.
Multifactorial moderate bilateral subarticular stenosis with slight
crowding of the descending L5 nerve roots. Mild/moderate central
canal stenosis. Moderate bilateral neural foraminal narrowing.

At L5-S1, there is moderate/advanced disc degeneration.
Multifactorial moderate/severe bilateral neural foraminal narrowing.

Mild lumbar levocurvature.

Multilevel grade 1 spondylolisthesis, as above.

## 2022-07-23 MED ORDER — TRESIBA FLEXTOUCH 100 UNIT/ML ~~LOC~~ SOPN: 35.0000 [IU] | PEN_INJECTOR | Freq: Every day | SUBCUTANEOUS | 2 refills | Status: DC

## 2022-08-12 ENCOUNTER — Ambulatory Visit: Payer: Medicare PPO | Admitting: Family Medicine

## 2022-08-12 ENCOUNTER — Encounter: Payer: Self-pay | Admitting: Family Medicine

## 2022-08-12 VITALS — BP 110/68 | HR 59 | Temp 98.6°F | Ht 68.0 in | Wt 201.4 lb

## 2022-08-12 DIAGNOSIS — H6591 Unspecified nonsuppurative otitis media, right ear: Secondary | ICD-10-CM

## 2022-08-12 MED ORDER — PREDNISONE 20 MG PO TABS: 40.0000 mg | ORAL_TABLET | Freq: Every day | ORAL | 0 refills | Status: AC

## 2022-08-12 NOTE — Progress Notes (Signed)
Chief Complaint  Patient presents with   Ear Pain    Pt is here for bilateral ear pain. Worse on the R.  Duration: 2 weeks Progression: unchanged Associated symptoms: fullness Denies: sore throat, congestion, fevers, coryza, bleeding, or discharge from ear Treatment to date: nothing Gets worse throughout day.   Past Medical History:  Diagnosis Date   Diabetes mellitus without complication (HCC)    GERD (gastroesophageal reflux disease)    History of heart attack 2006   Hyperlipidemia    Hypertension     BP 110/68 (BP Location: Right Arm, Patient Position: Sitting, Cuff Size: Normal)   Pulse (!) 59   Temp 98.6 F (37 C) (Oral)   Ht '5\' 8"'$  (1.727 m)   Wt 201 lb 6 oz (91.3 kg)   SpO2 96%   BMI 30.62 kg/m  General: Awake, alert, appearing stated age HEENT:  L ear- Canal patent without drainage or erythema, TM is neg R ear- canal patent without drainage or erythema, TM is slightly bulging with serous fluid present.  Nose- nares patent and without discharge Mouth- Lips, gums and dentition unremarkable, pharynx is without erythema or exudate Neck: No adenopathy Lungs: Normal effort, no accessory muscle use Psych: Age appropriate judgment and insight, normal mood and affect  Fluid level behind tympanic membrane of right ear - Plan: predniSONE (DELTASONE) 20 MG tablet  7 d pred burst 40 mg/d. Send message if no better of if new s/s's.  F/u prn.  Pt voiced understanding and agreement to the plan.  [place], DO 08/12/22 11:21 AM

## 2022-08-15 ENCOUNTER — Ambulatory Visit: Payer: Medicare PPO | Admitting: Podiatry

## 2022-08-15 DIAGNOSIS — E119 Type 2 diabetes mellitus without complications: Secondary | ICD-10-CM

## 2022-08-15 DIAGNOSIS — M79674 Pain in right toe(s): Secondary | ICD-10-CM | POA: Diagnosis not present

## 2022-08-15 DIAGNOSIS — M79675 Pain in left toe(s): Secondary | ICD-10-CM | POA: Diagnosis not present

## 2022-08-15 DIAGNOSIS — B351 Tinea unguium: Secondary | ICD-10-CM | POA: Diagnosis not present

## 2022-08-15 NOTE — Progress Notes (Signed)
Subjective: Chief Complaint  Patient presents with   Nail Problem    Diabetic Foot Care     72 y.o. returns the office today for painful, elongated, thickened toenails which he cannot trim himself.  He has no other concerns.  Otherwise he states his feet have been doing great.  Shelda Pal, DO  A1c: 6.7 on 07/01/2022  Objective: AAO 3, NAD DP/PT pulses palpable, CRT less than 3 seconds Protective sensation decreased with Simms Weinstein monofilament Nails hypertrophic, dystrophic, elongated, brittle, discolored 10. There is tenderness overlying the nails 1-5 bilaterally. There is no surrounding erythema or drainage along the nail sites. Dry skin present without any open lesions.  This is a chronic issue for him. No pain with calf compression, swelling, warmth, erythema.  Assessment: Patient presents with symptomatic onychomycosis  Plan: -Treatment options including alternatives, risks, complications were discussed -Nails sharply debrided 10 without complication/bleeding. -Continue moisturizer for the dry skin. -Discussed daily foot inspection. If there are any changes, to call the office immediately. Continue with supportive shoes. -Follow-up in 3 months or sooner if any problems are to arise. In the meantime, encouraged to call the office with any questions, concerns, changes symptoms.  Celesta Gentile, DPM

## 2022-08-29 ENCOUNTER — Other Ambulatory Visit: Payer: Self-pay | Admitting: Family Medicine

## 2022-09-03 DIAGNOSIS — H25813 Combined forms of age-related cataract, bilateral: Secondary | ICD-10-CM | POA: Diagnosis not present

## 2022-09-03 DIAGNOSIS — H04123 Dry eye syndrome of bilateral lacrimal glands: Secondary | ICD-10-CM | POA: Diagnosis not present

## 2022-09-03 DIAGNOSIS — H40023 Open angle with borderline findings, high risk, bilateral: Secondary | ICD-10-CM | POA: Diagnosis not present

## 2022-09-03 DIAGNOSIS — E119 Type 2 diabetes mellitus without complications: Secondary | ICD-10-CM | POA: Diagnosis not present

## 2022-09-07 DIAGNOSIS — G4733 Obstructive sleep apnea (adult) (pediatric): Secondary | ICD-10-CM | POA: Diagnosis not present

## 2022-10-01 ENCOUNTER — Encounter: Payer: Self-pay | Admitting: Family Medicine

## 2022-10-01 ENCOUNTER — Ambulatory Visit: Payer: Medicare PPO | Admitting: Family Medicine

## 2022-10-01 VITALS — BP 118/74 | HR 70 | Temp 99.4°F | Ht 68.0 in | Wt 198.2 lb

## 2022-10-01 DIAGNOSIS — J301 Allergic rhinitis due to pollen: Secondary | ICD-10-CM | POA: Diagnosis not present

## 2022-10-01 MED ORDER — MONTELUKAST SODIUM 10 MG PO TABS: ORAL_TABLET | ORAL | 1 refills | Status: DC

## 2022-10-01 MED ORDER — METHYLPREDNISOLONE ACETATE 80 MG/ML IJ SUSP
80.0000 mg | Freq: Once | INTRAMUSCULAR | Status: AC
Start: 2022-10-01 — End: 2022-10-01
  Administered 2022-10-01: 80 mg via INTRAMUSCULAR

## 2022-10-01 NOTE — Progress Notes (Signed)
Chief Complaint  Patient presents with   Allergies    Eye drainage Nasal drainage     Todd Parks here for URI complaints.  Duration: 4 days  Associated symptoms: sinus congestion, rhinorrhea, itchy watery eyes, and coughing from drainage Denies: sinus pain, ear pain, ear drainage, sore throat, wheezing, shortness of breath, myalgia, and fevers, sneezing Treatment to date: cetirizine, INCS Sick contacts: Yes; son's family  Past Medical History:  Diagnosis Date   Diabetes mellitus without complication    GERD (gastroesophageal reflux disease)    History of heart attack 2006   Hyperlipidemia    Hypertension     Objective BP 118/74 (BP Location: Left Arm, Patient Position: Sitting, Cuff Size: Normal)   Pulse 70   Temp 99.4 F (37.4 C) (Oral)   Ht  (1.727 m)   Wt 198 lb 4 oz (89.9 kg)   SpO2 97%   BMI 30.14 kg/m  General: Awake, alert, appears stated age HEENT: AT, [place], ears patent b/l and TM's neg, nares patent w clear discharge, pharynx pink and without exudates, MMM, no sinus ttp Neck: No masses or asymmetry Heart: RRR Lungs: CTAB, no accessory muscle use Psych: Age appropriate judgment and insight, normal mood and affect  Seasonal allergic rhinitis due to pollen - Plan: montelukast (SINGULAIR) 10 MG tablet, methylPREDNISolone acetate (DEPO-MEDROL) injection 80 mg  Exacerbation of chronic issue.  Depo-Medrol injection 80 mg today.  Continue Zyrtec 10 mg daily and Flonase.  Will add Singulair 10 mg daily during relative flares or seasons where his allergies started to pick up.  Continue to push fluids, practice good hand hygiene, cover mouth when coughing. F/u prn. If starting to experience fevers, shaking, or shortness of breath, seek immediate care. Pt voiced understanding and agreement to the plan.  Todd Roche [place], DO 10/01/22 3:12 PM

## 2022-10-03 ENCOUNTER — Encounter: Payer: Self-pay | Admitting: Family Medicine

## 2022-10-03 ENCOUNTER — Other Ambulatory Visit: Payer: Self-pay | Admitting: Family Medicine

## 2022-10-03 MED ORDER — DOXYCYCLINE HYCLATE 100 MG PO TABS: 100.0000 mg | ORAL_TABLET | Freq: Two times a day (BID) | ORAL | 0 refills | Status: AC

## 2022-11-14 ENCOUNTER — Ambulatory Visit: Payer: Medicare PPO | Admitting: Podiatry

## 2022-11-14 DIAGNOSIS — B351 Tinea unguium: Secondary | ICD-10-CM | POA: Diagnosis not present

## 2022-11-14 DIAGNOSIS — M79674 Pain in right toe(s): Secondary | ICD-10-CM | POA: Diagnosis not present

## 2022-11-14 DIAGNOSIS — E119 Type 2 diabetes mellitus without complications: Secondary | ICD-10-CM | POA: Diagnosis not present

## 2022-11-14 DIAGNOSIS — M79675 Pain in left toe(s): Secondary | ICD-10-CM

## 2022-11-14 NOTE — Patient Instructions (Signed)

## 2022-11-18 NOTE — Progress Notes (Signed)
Subjective: Chief Complaint  Patient presents with   Nail Problem    Nail trim     72 y.o. returns the office today for painful, elongated, thickened toenails which he cannot trim himself.  He has no other concerns.  Otherwise he states his feet have been doing great.  Sharlene Dory, DO  A1c: 6.7 on 07/01/2022  Objective: AAO 3, NAD DP/PT pulses palpable, CRT less than 3 seconds Protective sensation decreased with Simms Weinstein monofilament Nails hypertrophic, dystrophic, elongated, brittle, discolored 10. There is tenderness overlying the nails 1-5 bilaterally. There is no surrounding erythema or drainage along the nail sites. Dry skin remains without any open lesions.   No pain with calf compression, swelling, warmth, erythema.  Assessment: Patient presents with symptomatic onychomycosis  Plan: -Treatment options including alternatives, risks, complications were discussed -Nails sharply debrided 10 without complication/bleeding. -Continue moisturizer for the dry skin. -Discussed daily foot inspection. If there are any changes, to call the office immediately. Continue with supportive shoes. -Follow-up in 3 months or sooner if any problems are to arise. In the meantime, encouraged to call the office with any questions, concerns, changes symptoms.  Ovid Curd, DPM

## 2022-11-24 ENCOUNTER — Other Ambulatory Visit: Payer: Self-pay | Admitting: Family Medicine

## 2022-12-10 DIAGNOSIS — L579 Skin changes due to chronic exposure to nonionizing radiation, unspecified: Secondary | ICD-10-CM | POA: Diagnosis not present

## 2022-12-10 DIAGNOSIS — L821 Other seborrheic keratosis: Secondary | ICD-10-CM | POA: Diagnosis not present

## 2022-12-10 DIAGNOSIS — L570 Actinic keratosis: Secondary | ICD-10-CM | POA: Diagnosis not present

## 2022-12-10 DIAGNOSIS — Z85828 Personal history of other malignant neoplasm of skin: Secondary | ICD-10-CM | POA: Diagnosis not present

## 2022-12-10 DIAGNOSIS — L814 Other melanin hyperpigmentation: Secondary | ICD-10-CM | POA: Diagnosis not present

## 2022-12-13 ENCOUNTER — Other Ambulatory Visit: Payer: Self-pay | Admitting: Family Medicine

## 2022-12-26 DIAGNOSIS — I2510 Atherosclerotic heart disease of native coronary artery without angina pectoris: Secondary | ICD-10-CM | POA: Diagnosis not present

## 2022-12-30 ENCOUNTER — Ambulatory Visit: Payer: Medicare PPO | Admitting: Family Medicine

## 2023-01-11 ENCOUNTER — Other Ambulatory Visit: Payer: Self-pay | Admitting: Family Medicine

## 2023-01-13 ENCOUNTER — Encounter: Payer: Self-pay | Admitting: Family Medicine

## 2023-01-13 ENCOUNTER — Other Ambulatory Visit: Payer: Self-pay | Admitting: Family Medicine

## 2023-01-13 NOTE — Telephone Encounter (Signed)
Okay to refill ? Last filled in 2022 by previous provider

## 2023-01-14 DIAGNOSIS — H04123 Dry eye syndrome of bilateral lacrimal glands: Secondary | ICD-10-CM | POA: Diagnosis not present

## 2023-01-14 DIAGNOSIS — H25813 Combined forms of age-related cataract, bilateral: Secondary | ICD-10-CM | POA: Diagnosis not present

## 2023-01-14 DIAGNOSIS — E119 Type 2 diabetes mellitus without complications: Secondary | ICD-10-CM | POA: Diagnosis not present

## 2023-01-14 DIAGNOSIS — H40023 Open angle with borderline findings, high risk, bilateral: Secondary | ICD-10-CM | POA: Diagnosis not present

## 2023-01-14 MED ORDER — FENOFIBRATE 145 MG PO TABS: 145.0000 mg | ORAL_TABLET | Freq: Every day | ORAL | 0 refills | Status: AC

## 2023-01-15 ENCOUNTER — Encounter (INDEPENDENT_AMBULATORY_CARE_PROVIDER_SITE_OTHER): Payer: Self-pay

## 2023-02-03 ENCOUNTER — Other Ambulatory Visit: Payer: Self-pay | Admitting: Family Medicine

## 2023-02-05 ENCOUNTER — Encounter: Payer: Self-pay | Admitting: Family Medicine

## 2023-02-05 ENCOUNTER — Ambulatory Visit: Payer: Medicare PPO | Admitting: Family Medicine

## 2023-02-05 VITALS — BP 108/70 | HR 57 | Temp 98.6°F | Ht 68.0 in | Wt 197.1 lb

## 2023-02-05 DIAGNOSIS — I10 Essential (primary) hypertension: Secondary | ICD-10-CM

## 2023-02-05 DIAGNOSIS — E669 Obesity, unspecified: Secondary | ICD-10-CM

## 2023-02-05 DIAGNOSIS — E782 Mixed hyperlipidemia: Secondary | ICD-10-CM | POA: Diagnosis not present

## 2023-02-05 DIAGNOSIS — Z7984 Long term (current) use of oral hypoglycemic drugs: Secondary | ICD-10-CM | POA: Diagnosis not present

## 2023-02-05 DIAGNOSIS — E1169 Type 2 diabetes mellitus with other specified complication: Secondary | ICD-10-CM

## 2023-02-05 LAB — COMPREHENSIVE METABOLIC PANEL
ALT: 23 U/L (ref 0–53)
AST: 26 U/L (ref 0–37)
Albumin: 4.4 g/dL (ref 3.5–5.2)
Alkaline Phosphatase: 28 U/L — ABNORMAL LOW (ref 39–117)
BUN: 21 mg/dL (ref 6–23)
CO2: 29 mEq/L (ref 19–32)
Calcium: 9.4 mg/dL (ref 8.4–10.5)
Chloride: 102 mEq/L (ref 96–112)
Creatinine, Ser: 1.3 mg/dL (ref 0.40–1.50)
GFR: 54.93 mL/min — ABNORMAL LOW (ref >60.00)
Glucose, Bld: 79 mg/dL (ref 70–99)
Potassium: 4.2 mEq/L (ref 3.5–5.1)
Sodium: 138 mEq/L (ref 135–145)
Total Bilirubin: 0.8 mg/dL (ref 0.2–1.2)
Total Protein: 6.8 g/dL (ref 6.0–8.3)

## 2023-02-05 LAB — MICROALBUMIN / CREATININE URINE RATIO
Creatinine,U: 72.3 mg/dL
Microalb Creat Ratio: 1 mg/g (ref 0.0–30.0)
Microalb, Ur: <0.7 mg/dL (ref 0.0–1.9)

## 2023-02-05 LAB — LIPID PANEL
Cholesterol: 132 mg/dL (ref 0–200)
HDL: 30.7 mg/dL — ABNORMAL LOW (ref >39.00)
LDL Cholesterol: 73 mg/dL (ref 0–99)
NonHDL: 101.45
Total CHOL/HDL Ratio: 4
Triglycerides: 141 mg/dL (ref 0.0–149.0)
VLDL: 28.2 mg/dL (ref 0.0–40.0)

## 2023-02-05 LAB — HEMOGLOBIN A1C: Hgb A1c MFr Bld: 6 % (ref 4.6–6.5)

## 2023-02-05 NOTE — Patient Instructions (Addendum)
Give [place] 2-3 business days to get the results of your labs back.   Keep the diet clean and stay active.  I recommend getting the flu shot in mid October. This suggestion would change if the CDC comes out with a different recommendation.   Take Flonase daily for the next 3-4 weeks to open up the R ear.   Let us know if you need anything.

## 2023-02-05 NOTE — Progress Notes (Signed)
Subjective:   Chief Complaint  Patient presents with   Follow-up    6 month    Todd Parks is a 72 y.o. male here for follow-up of diabetes.   Tyrelle does not routinely check his sugars.  Patient does not require insulin.   Medications include: Invokana 300 mg daily, metformin 1000 mg twice daily Diet is OK.  Exercise: active in yard, house, some walking  Hypertension Patient presents for hypertension follow up. He does not monitor home blood pressures. He is compliant with medications- Toprol XL 100 mg/d, valsartan 160 mg/d. Patient has these side effects of medication: none Diet/exercise as above.  No CP or SOB.   Mixed Hyperlipidemia Patient presents for mixed hyperlipidemia follow up. Currently being treated with Crestor 20 mg/d, fenofibrate 145 mg/d and compliance with treatment thus far has been good. He denies myalgias. Diet/exercise as above.  The patient is not known to have coexisting coronary artery disease.  Past Medical History:  Diagnosis Date   Diabetes mellitus without complication (HCC)    GERD (gastroesophageal reflux disease)    History of heart attack 2006   Hyperlipidemia    Hypertension      Related testing: Retinal exam: Done Pneumovax: done  Objective:  BP 108/70 (BP Location: Left Arm, Patient Position: Sitting, Cuff Size: Normal)   Pulse (!) 57   Temp 98.6 F (37 C) (Oral)   Ht 5\' 8"  (1.727 m)   Wt 197 lb 2 oz (89.4 kg)   SpO2 99%   BMI 29.97 kg/m  General:  Well developed, well nourished, in no apparent distress Ears: Canals patent bilaterally, mild bulging of the right TM compared to the left, no effusion or erythema Lungs:  CTAB, no access msc use Cardio:  RRR, no bruits, no LE edema Psych: Age appropriate judgment and insight  Assessment:   Type 2 diabetes mellitus with obesity (HCC) - Plan: Comprehensive metabolic panel, Lipid panel, Hemoglobin A1c, Microalbumin / creatinine urine ratio  Essential hypertension  Mixed  hyperlipidemia   Plan:   Chronic, stable.  Continue Invokana 300 mg daily, metformin 1000 mg twice daily.  Counseled on diet and exercise. Chronic, stable.  Continue valsartan 160 mg daily, metoprolol XL 100 mg daily. Chronic, stable.  Continue fenofibrate 145 mg daily, Crestor 20 mg daily. Flu shot recommended for mid October. Flonase daily for likely eustachian tube dysfunction. F/u in 6 mo. The patient voiced understanding and agreement to the plan.  Jilda Roche Hollins, DO 02/05/23 9:51 AM

## 2023-02-07 ENCOUNTER — Other Ambulatory Visit: Payer: Self-pay | Admitting: Family Medicine

## 2023-02-07 ENCOUNTER — Encounter: Payer: Self-pay | Admitting: Family Medicine

## 2023-02-07 DIAGNOSIS — G4733 Obstructive sleep apnea (adult) (pediatric): Secondary | ICD-10-CM

## 2023-02-13 ENCOUNTER — Ambulatory Visit: Payer: Medicare PPO | Admitting: Podiatry

## 2023-02-13 DIAGNOSIS — B351 Tinea unguium: Secondary | ICD-10-CM | POA: Diagnosis not present

## 2023-02-13 DIAGNOSIS — E119 Type 2 diabetes mellitus without complications: Secondary | ICD-10-CM | POA: Diagnosis not present

## 2023-02-13 DIAGNOSIS — M79675 Pain in left toe(s): Secondary | ICD-10-CM | POA: Diagnosis not present

## 2023-02-13 DIAGNOSIS — M79674 Pain in right toe(s): Secondary | ICD-10-CM | POA: Diagnosis not present

## 2023-02-19 NOTE — Progress Notes (Signed)
Subjective: No chief complaint on file.   72 y.o. returns the office today for painful, elongated, thickened toenails which he cannot trim himself.  He has no other concerns.  Otherwise he states his feet have been doing great.  Does not report any open lesions.  Sharlene Dory, DO Last seen on February 05, 2023  A1c: 6 on 02/05/2023  Objective: AAO 3, NAD DP/PT pulses palpable, CRT less than 3 seconds Protective sensation decreased with Dorann Ou monofilament Nails hypertrophic, dystrophic, elongated, brittle, discolored 10. There is tenderness overlying the nails 1-5 bilaterally. There is no surrounding erythema or drainage along the nail sites. Dry skin remains without any open lesions.   No pain with calf compression, swelling, warmth, erythema.  Assessment: Patient presents with symptomatic onychomycosis  Plan: -Treatment options including alternatives, risks, complications were discussed -Nails sharply debrided 10 without complication/bleeding. -Continue moisturizer for the dry skin. -Discussed daily foot inspection. If there are any changes, to call the office immediately. -Follow-up in 3 months or sooner if any problems are to arise. In the meantime, encouraged to call the office with any questions, concerns, changes symptoms.  Ovid Curd, DPM

## 2023-02-28 DIAGNOSIS — G4733 Obstructive sleep apnea (adult) (pediatric): Secondary | ICD-10-CM | POA: Diagnosis not present

## 2023-03-17 ENCOUNTER — Encounter: Payer: Self-pay | Admitting: Nurse Practitioner

## 2023-03-17 ENCOUNTER — Ambulatory Visit (INDEPENDENT_AMBULATORY_CARE_PROVIDER_SITE_OTHER): Payer: Medicare PPO | Admitting: Nurse Practitioner

## 2023-03-17 VITALS — BP 112/60 | HR 71 | Ht 68.0 in | Wt 199.6 lb

## 2023-03-17 DIAGNOSIS — G4733 Obstructive sleep apnea (adult) (pediatric): Secondary | ICD-10-CM | POA: Diagnosis not present

## 2023-03-17 NOTE — Assessment & Plan Note (Signed)
OSA on CPAP. Excellent compliance per his report. Receives benefit from use. Wearing a nasal cradle mask. Will request download from [employer]. He is due for a new machine so we will place orders for this today. Understands proper use/care of device. Aware of safe driving practices.   Patient Instructions  Continue CPAP every night, minimum of 4-6 hours a night.  Change equipment as directed. Wash your tubing with warm soap and water daily, hang to dry. Wash humidifier portion weekly. Use bottled, distilled water and change daily   Be aware of reduced alertness and do not drive or operate heavy machinery if experiencing this or drowsiness.  Exercise encouraged, as tolerated.  Attend mask fitting at [facility] - they will contact you to schedule this  Orders placed for new CPAP machine - you should hear something in the next 2-3 weeks from [employer] about this  Follow up in 3 months with Dr. Wynona Neat or Philis Nettle, or sooner, if needed

## 2023-03-17 NOTE — Progress Notes (Signed)
@Patient  ID: Todd Parks, male    DOB: 07-02-1950, 72 y.o.   MRN: [id number]  Chief Complaint  Patient presents with   Consult    Pt is here for Sleep Consult. Pt need CPAP Supplies.    Referring provider: Sharlene Dory*  HPI: 72 year old male, never smoker referred for sleep consult. Past medical history significant for ischemic heart disease, CAD, HTN, OSA on CPAP, allergic rhinitis, GERD, DM, BPH, HLD.   TEST/EVENTS:  07/2017 HST: AHI 19/h  03/17/2023: Today - sleep consult Patient resents today for sleep consult, referred by Dr. Carmelia Roller.  He has a history of a for sleep apnea on CPAP for almost 20 years now.  Last sleep study was in 2019 with moderate OSA.  Without CPAP, he has loud snoring and daytime fatigue.  With CPAP, he sleeps well and feels energized the next day.  Wears his CPAP nightly.  Denies any drowsy driving, morning headaches, sleep parasomnia/paralysis.  No history of narcolepsy or cataplexy. Goes to bed between 8 to 11 PM.  Falls asleep quickly.  Wakes 2-3 times a night.  Gets up around 5:30 AM.  Weight has been stable over the last 2 years.  Currently on CPAP with set pressure 10 cm of water.  Does not wear any supplemental oxygen. He has a history of high blood pressure and diabetes, well-controlled on current medication regimen.  Does have a history of MI.  No history of stroke. He is a never smoker.  Does not drink alcohol.  No excessive caffeine intake.  Lives with his wife.  He retired.  No significant family history.  Epworth 6  No Known Allergies  Immunization History  Administered Date(s) Administered   Fluad Quad(high Dose 65+) 03/20/2020, 04/09/2021, 07/01/2022   Influenza, High Dose Seasonal PF 04/03/2016, 04/03/2017, 04/06/2018, 03/31/2019   PNEUMOCOCCAL CONJUGATE-20 07/21/2020   Pneumococcal Conjugate-13 04/03/2016   Pneumococcal Polysaccharide-23 04/03/2017   Tdap 06/17/2012, 07/01/2022   Zoster Recombinant(Shingrix) 04/01/2019,  07/02/2019   Zoster, Live 06/18/2011    Past Medical History:  Diagnosis Date   Diabetes mellitus without complication (HCC)    GERD (gastroesophageal reflux disease)    History of heart attack 2006   Hyperlipidemia    Hypertension     Tobacco History: Social History   Tobacco Use  Smoking Status Never  Smokeless Tobacco Never   Counseling given: Not Answered   Outpatient Medications Prior to Visit  Medication Sig Dispense Refill   ACCU-CHEK AVIVA PLUS test strip Use daily to check blood sugar.  DXE11.9 100 each 3   Accu-Chek Softclix Lancets lancets Use daily to check blood sugar..  DX E11.9 100 each 3   aspirin EC 81 MG tablet Take by mouth.     BD PEN NEEDLE NANO 2ND GEN 32G X 4 MM MISC USE ONCE DAILY WITH LEVEMIR PEN 100 each 1   Blood Glucose Monitoring Suppl (FIFTY50 GLUCOSE METER 2.0) w/Device KIT Use as instructed     Cholecalciferol (VITAMIN D3) 50 MCG (2000 UT) capsule      Clobetasol Propionate 0.05 % shampoo APPLY TO AFFECTED AREA EVERY DAY  3   fenofibrate (TRICOR) 145 MG tablet Take 1 tablet (145 mg total) by mouth daily. 90 tablet 0   INVOKANA 300 MG TABS tablet TAKE 1 TABLET BY MOUTH EVERY DAY 90 tablet 2   metFORMIN (GLUCOPHAGE) 1000 MG tablet TAKE 1 TABLET (1,000 MG TOTAL) BY MOUTH TWICE A DAY WITH MEALS 180 tablet 1   metoprolol succinate (  TOPROL-XL) 100 MG 24 hr tablet TAKE 1 TABLET BY MOUTH EVERY DAY 90 tablet 3   montelukast (SINGULAIR) 10 MG tablet TAKE 1 TABLET BY MOUTH DAILY DURING POLLEN AND MOLD SEASON 90 tablet 1   nitroGLYCERIN (NITROSTAT) 0.4 MG SL tablet Place under the tongue.     omeprazole (PRILOSEC) 20 MG capsule TAKE 1 CAPSULE BY MOUTH EVERY DAY 90 capsule 2   rosuvastatin (CRESTOR) 20 MG tablet Take 1 tablet (20 mg total) by mouth daily. 90 tablet 3   valsartan (DIOVAN) 160 MG tablet TAKE 1 TABLET BY MOUTH EVERY DAY 90 tablet 2   TRESIBA FLEXTOUCH 100 UNIT/ML FlexTouch Pen INJECT 35 UNITS INTO THE SKIN DAILY. 10.5 mL 2   No  facility-administered medications prior to visit.     Review of Systems:   Constitutional: No weight loss or gain, night sweats, fevers, chills, fatigue, or lassitude. HEENT: No headaches, difficulty swallowing, tooth/dental problems, or sore throat. No sneezing, itching, ear ache, nasal congestion, or post nasal drip CV:  No chest pain, orthopnea, PND, swelling in lower extremities, anasarca, dizziness, palpitations, syncope Resp: No shortness of breath with exertion or at rest. No excess mucus or change in color of mucus. No productive or non-productive. No hemoptysis. No wheezing.  No chest wall deformity GI:  +heartburn, indigestion (well controlled on PPI). abdominal pain, nausea, vomiting, diarrhea, change in bowel habits, loss of appetite, bloody stools.  GU: No dysuria, change in color of urine, urgency or frequency.  Skin: No rash, lesions, ulcerations MSK:  No joint pain or swelling.   Neuro: No dizziness or lightheadedness.  Psych: No depression or anxiety. Mood stable.     Physical Exam:  BP 112/60 (BP Location: Left Arm, Cuff Size: Large)   Pulse 71   Ht 5\' 8"  (1.727 m)   Wt 199 lb 9.6 oz (90.5 kg)   SpO2 97%   BMI 30.35 kg/m   GEN: Pleasant, interactive, well-appearing; obese; in no acute distress HEENT:  Normocephalic and atraumatic. PERRLA. Sclera white. Nasal turbinates pink, moist and patent bilaterally. No rhinorrhea present. Oropharynx pink and moist, without exudate or edema. No lesions, ulcerations, or postnasal drip. Mallampati III NECK:  Supple w/ fair ROM. No JVD present. Normal carotid impulses w/o bruits. Thyroid symmetrical with no goiter or nodules palpated. No lymphadenopathy.   CV: RRR, no m/r/g, no peripheral edema. Pulses intact, +2 bilaterally. No cyanosis, pallor or clubbing. PULMONARY:  Unlabored, regular breathing. Clear bilaterally A&P w/o wheezes/rales/rhonchi. No accessory muscle use.  GI: BS present and normoactive. Soft, non-tender to  palpation. No organomegaly or masses detected.  MSK: No erythema, warmth or tenderness. Cap refil <2 sec all extrem. No deformities or joint swelling noted.  Neuro: A/Ox3. No focal deficits noted.   Skin: Warm, no lesions or rashe Psych: Normal affect and behavior. Judgement and thought content appropriate.     Lab Results:  CBC    Component Value Date/Time   WBC 5.5 07/01/2022 0904   RBC 4.77 07/01/2022 0904   HGB 14.0 07/01/2022 0904   HCT 41.7 07/01/2022 0904   PLT 202.0 07/01/2022 0904   MCV 87.5 07/01/2022 0904   MCH 29.6 05/18/2019 0836   MCHC 33.6 07/01/2022 0904   RDW 13.5 07/01/2022 0904   LYMPHSABS 1,803 05/18/2019 0836   EOSABS 108 05/18/2019 0836   BASOSABS 39 05/18/2019 0836    BMET    Component Value Date/Time   NA 138 02/05/2023 1007   K 4.2 02/05/2023 1007   CL  102 02/05/2023 1007   CO2 29 02/05/2023 1007   GLUCOSE 79 02/05/2023 1007   BUN 21 02/05/2023 1007   CREATININE 1.30 02/05/2023 1007   CREATININE 1.31 (H) 03/20/2020 1403   CALCIUM 9.4 02/05/2023 1007   GFRNONAA 57 (L) 05/18/2019 0836   GFRAA 66 05/18/2019 0836    BNP No results found for: "BNP"   Imaging:  No results found.  Administration History     None           No data to display          No results found for: "NITRICOXIDE"      Assessment & Plan:   OSA (obstructive sleep apnea) OSA on CPAP. Excellent compliance per his report. Receives benefit from use. Wearing a nasal cradle mask. Will request download from [employer]. He is due for a new machine so we will place orders for this today. Understands proper use/care of device. Aware of safe driving practices.   Patient Instructions  Continue CPAP every night, minimum of 4-6 hours a night.  Change equipment as directed. Wash your tubing with warm soap and water daily, hang to dry. Wash humidifier portion weekly. Use bottled, distilled water and change daily   Be aware of reduced alertness and do not drive or operate  heavy machinery if experiencing this or drowsiness.  Exercise encouraged, as tolerated.  Attend mask fitting at [facility] - they will contact you to schedule this  Orders placed for new CPAP machine - you should hear something in the next 2-3 weeks from [employer] about this  Follow up in 3 months with Dr. Wynona Neat or Philis Nettle, or sooner, if needed    I spent 35 minutes of dedicated to the care of this patient on the date of this encounter to include pre-visit review of records, face-to-face time with the patient discussing conditions above, post visit ordering of testing, clinical documentation with the electronic health record, making appropriate referrals as documented, and communicating necessary findings to members of the patients care team.  Noemi Chapel, NP 03/17/2023  Pt aware and understands NP's role.

## 2023-03-17 NOTE — Patient Instructions (Signed)
Continue CPAP every night, minimum of 4-6 hours a night.  Change equipment as directed. Wash your tubing with warm soap and water daily, hang to dry. Wash humidifier portion weekly. Use bottled, distilled water and change daily   Be aware of reduced alertness and do not drive or operate heavy machinery if experiencing this or drowsiness.  Exercise encouraged, as tolerated.  Attend mask fitting at [facility] - they will contact you to schedule this  Orders placed for new CPAP machine - you should hear something in the next 2-3 weeks from [employer] about this  Follow up in 3 months with Dr. Wynona Neat or Philis Nettle, or sooner, if needed

## 2023-03-20 ENCOUNTER — Ambulatory Visit: Payer: Medicare PPO

## 2023-03-20 DIAGNOSIS — Z0000 Encounter for general adult medical examination without abnormal findings: Secondary | ICD-10-CM | POA: Diagnosis not present

## 2023-03-20 NOTE — Progress Notes (Signed)
Subjective:   Todd Parks is a 72 y.o. male who presents for Medicare Annual/Subsequent preventive examination.  Visit Complete: Virtual  I connected with  Todd Parks on 03/20/23 by a audio enabled telemedicine application and verified that I am speaking with the correct person using two identifiers.  Patient Location: Home  Provider Location: Office/Clinic  I discussed the limitations of evaluation and management by telemedicine. The patient expressed understanding and agreed to proceed.  Because this visit was a virtual/telehealth visit, some criteria may be missing or patient reported. Any vitals not documented were not able to be obtained and vitals that have been documented are patient reported.   Cardiac Risk Factors include: advanced age (>87men, >60 women);diabetes mellitus;dyslipidemia;hypertension;male gender;obesity (BMI >30kg/m2)     Objective:    Today's Vitals   03/20/23 0943  PainSc: 2    There is no height or weight on file to calculate BMI.     03/20/2023    9:49 AM 03/14/2022    9:03 AM 03/12/2021    8:24 AM  Advanced Directives  Does Patient Have a Medical Advance Directive? No Yes Yes  Type of Special educational needs teacher of [place];Living will Healthcare Power of [place];Living will  Copy of Healthcare Power of Attorney in Chart?  No - copy requested No - copy requested  Would patient like information on creating a medical advance directive? No - Patient declined      Current Medications (verified) Outpatient Encounter Medications as of 03/20/2023  Medication Sig   ACCU-CHEK AVIVA PLUS test strip Use daily to check blood sugar.  DXE11.9   Accu-Chek Softclix Lancets lancets Use daily to check blood sugar..  DX E11.9   aspirin EC 81 MG tablet Take by mouth.   BD PEN NEEDLE NANO 2ND GEN 32G X 4 MM MISC USE ONCE DAILY WITH LEVEMIR PEN   Blood Glucose Monitoring Suppl (FIFTY50 GLUCOSE METER 2.0) w/Device KIT Use as instructed   Cholecalciferol  (VITAMIN D3) 50 MCG (2000 UT) capsule    Clobetasol Propionate 0.05 % shampoo APPLY TO AFFECTED AREA EVERY DAY   fenofibrate (TRICOR) 145 MG tablet Take 1 tablet (145 mg total) by mouth daily.   INVOKANA 300 MG TABS tablet TAKE 1 TABLET BY MOUTH EVERY DAY   metFORMIN (GLUCOPHAGE) 1000 MG tablet TAKE 1 TABLET (1,000 MG TOTAL) BY MOUTH TWICE A DAY WITH MEALS   metoprolol succinate (TOPROL-XL) 100 MG 24 hr tablet TAKE 1 TABLET BY MOUTH EVERY DAY   montelukast (SINGULAIR) 10 MG tablet TAKE 1 TABLET BY MOUTH DAILY DURING POLLEN AND MOLD SEASON   nitroGLYCERIN (NITROSTAT) 0.4 MG SL tablet Place under the tongue.   omeprazole (PRILOSEC) 20 MG capsule TAKE 1 CAPSULE BY MOUTH EVERY DAY   rosuvastatin (CRESTOR) 20 MG tablet Take 1 tablet (20 mg total) by mouth daily.   valsartan (DIOVAN) 160 MG tablet TAKE 1 TABLET BY MOUTH EVERY DAY   TRESIBA FLEXTOUCH 100 UNIT/ML FlexTouch Pen INJECT 35 UNITS INTO THE SKIN DAILY.   No facility-administered encounter medications on file as of 03/20/2023.    Allergies (verified) Patient has no known allergies.   History: Past Medical History:  Diagnosis Date   Diabetes mellitus without complication (HCC)    GERD (gastroesophageal reflux disease)    History of heart attack 2006   Hyperlipidemia    Hypertension    Past Surgical History:  Procedure Laterality Date   BREAST SURGERY     mass   CARPAL TUNNEL RELEASE Bilateral  ROTATOR CUFF REPAIR Bilateral    VASECTOMY     Family History  Problem Relation Age of Onset   Cancer Neg Hx    Social History   Socioeconomic History   Marital status: Married    Spouse name: Not on file   Number of children: Not on file   Years of education: Not on file   Highest education level: Associate degree: academic program  Occupational History   Not on file  Tobacco Use   Smoking status: Never   Smokeless tobacco: Never  Substance and Sexual Activity   Alcohol use: Never   Drug use: Never   Sexual activity:  Not on file  Other Topics Concern   Not on file  Social History Narrative   Not on file   Social Determinants of Health   Financial Resource Strain: Low Risk  (03/20/2023)   Overall Financial Resource Strain (CARDIA)    Difficulty of Paying Living Expenses: Not very hard  Food Insecurity: No Food Insecurity (03/20/2023)   Hunger Vital Sign    Worried About Running Out of Food in the Last Year: Never true    Ran Out of Food in the Last Year: Never true  Transportation Needs: No Transportation Needs (03/20/2023)   PRAPARE - Administrator, Civil Service (Medical): No    Lack of Transportation (Non-Medical): No  Physical Activity: Sufficiently Active (03/20/2023)   Exercise Vital Sign    Days of Exercise per Week: 3 days    Minutes of Exercise per Session: 60 min  Stress: No Stress Concern Present (03/20/2023)   [employer] of Occupational Health - Occupational Stress Questionnaire    Feeling of Stress : Only a little  Social Connections: Moderately Integrated (03/20/2023)   Social Connection and Isolation Panel [NHANES]    Frequency of Communication with Friends and Family: More than three times a week    Frequency of Social Gatherings with Friends and Family: More than three times a week    Attends Religious Services: Never    Database administrator or Organizations: Yes    Attends Engineer, structural: More than 4 times per year    Marital Status: Married    Tobacco Counseling Counseling given: Not Answered   Clinical Intake:  Pre-visit preparation completed: Yes  Pain : 0-10 Pain Score: 2  Pain Type: Chronic pain Pain Location: Back Pain Orientation: Lower Pain Descriptors / Indicators: Aching Pain Onset: More than a month ago Pain Frequency: Intermittent     Nutritional Status: BMI > 30  Obese Nutritional Risks: None Diabetes: Yes CBG done?: No Did pt. bring in CBG monitor from home?: No  How often do you need to have someone help you  when you read instructions, pamphlets, or other written materials from your doctor or pharmacy?: 1 - Never  Interpreter Needed?: No  Information entered by :: Kennedy Bucker, LPN   Activities of Daily Living    03/20/2023    9:50 AM 03/19/2023   10:39 AM  In your present state of health, do you have any difficulty performing the following activities:  Hearing? 1 1  Vision? 0 0  Difficulty concentrating or making decisions? 0 1  Walking or climbing stairs? 0 0  Dressing or bathing? 0 0  Doing errands, shopping? 0 0  Preparing Food and eating ? N N  Using the Toilet? N N  In the past six months, have you accidently leaked urine? N N  Do you have  problems with loss of bowel control? N Y  Managing your Medications? N N  Managing your Finances? N N  Housekeeping or managing your Housekeeping? N N    Patient Care Team: Sharlene Dory, DO as PCP - General (Family Medicine)  Indicate any recent Medical Services you may have received from other than [facility] providers in the past year (date may be approximate).     Assessment:   This is a routine wellness examination for Todd Parks.  Hearing/Vision screen Hearing Screening - Comments:: Wears aids Vision Screening - Comments:: Readers- has cataracts- Dr.Wynne   Goals Addressed             This Visit's Progress    DIET - EAT MORE FRUITS AND VEGETABLES         Depression Screen    03/20/2023    9:46 AM 07/01/2022    8:29 AM 03/14/2022    9:04 AM 12/24/2021    7:25 AM 03/12/2021    8:26 AM 09/19/2020    9:24 AM  PHQ 2/9 Scores  PHQ - 2 Score 0 0 0 0 0 0  PHQ- 9 Score 0 0        Fall Risk    03/20/2023    9:50 AM 03/19/2023   10:39 AM 07/01/2022    8:28 AM 03/14/2022    9:19 AM 12/24/2021    7:25 AM  Fall Risk   Falls in the past year? 0 0 0 0 0  Number falls in past yr: 0 0 0 0 0  Injury with Fall? 0 0 0 0 0  Risk for fall due to : No Fall Risks  History of fall(s) No Fall Risks No Fall Risks  Follow up Falls  prevention discussed;Falls evaluation completed  Falls evaluation completed Falls evaluation completed Falls evaluation completed    MEDICARE RISK AT HOME: Medicare Risk at Home Any stairs in or around the home?: Yes If so, are there any without handrails?: No Home free of loose throw rugs in walkways, pet beds, electrical cords, etc?: Yes Adequate lighting in your home to reduce risk of falls?: Yes Life alert?: No Use of a cane, walker or w/c?: No Grab bars in the bathroom?: No Shower chair or bench in shower?: No Elevated toilet seat or a handicapped toilet?: Yes  TIMED UP AND GO:  Was the test performed?  No    Cognitive Function:        03/20/2023    9:51 AM 03/14/2022    9:10 AM  6CIT Screen  What Year? 0 points 0 points  What month? 0 points 0 points  What time? 0 points 0 points  Count back from 20 0 points 0 points  Months in reverse 0 points 0 points  Repeat phrase 0 points 0 points  Total Score 0 points 0 points    Immunizations Immunization History  Administered Date(s) Administered   Fluad Quad(high Dose 65+) 03/20/2020, 04/09/2021, 07/01/2022   Influenza, High Dose Seasonal PF 04/03/2016, 04/03/2017, 04/06/2018, 03/31/2019   PNEUMOCOCCAL CONJUGATE-20 07/21/2020   Pneumococcal Conjugate-13 04/03/2016   Pneumococcal Polysaccharide-23 04/03/2017   Tdap 06/17/2012, 07/01/2022   Zoster Recombinant(Shingrix) 04/01/2019, 07/02/2019   Zoster, Live 06/18/2011    TDAP status: Up to date  Flu Vaccine status: Up to date- will have on in Mid October  Pneumococcal vaccine status: Up to date  Covid-19 vaccine status: Declined, Education has been provided regarding the importance of this vaccine but patient still declined. Advised may receive this  vaccine at local pharmacy or Health Dept.or vaccine clinic. Aware to provide a copy of the vaccination record if obtained from local pharmacy or Health Dept. Verbalized acceptance and understanding.  Qualifies for  Shingles Vaccine? Yes   Zostavax completed Yes   Shingrix Completed?: Yes  Screening Tests Health Maintenance  Topic Date Due   INFLUENZA VACCINE  01/16/2023   FOOT EXAM  07/02/2023   HEMOGLOBIN A1C  08/08/2023   OPHTHALMOLOGY EXAM  09/05/2023   Diabetic kidney evaluation - eGFR measurement  02/05/2024   Diabetic kidney evaluation - Urine ACR  02/05/2024   Medicare Annual Wellness (AWV)  03/19/2024   Colonoscopy  11/30/2029   DTaP/Tdap/Td (3 - Td or Tdap) 07/01/2032   Pneumonia Vaccine 30+ Years old  Completed   Hepatitis C Screening  Completed   Zoster Vaccines- Shingrix  Completed   HPV VACCINES  Aged Out   COVID-19 Vaccine  Discontinued    Health Maintenance  Health Maintenance Due  Topic Date Due   INFLUENZA VACCINE  01/16/2023    Colorectal cancer screening: Type of screening: Colonoscopy. Completed 12/01/19. Repeat every 5 years  Lung Cancer Screening: (Low Dose CT Chest recommended if Age 81-80 years, 20 pack-year currently smoking OR have quit w/in 15years.) does not qualify.     Additional Screening:  Hepatitis C Screening: does qualify; Completed 09/19/20  Vision Screening: Recommended annual ophthalmology exams for early detection of glaucoma and other disorders of the eye. Is the patient up to date with their annual eye exam?  Yes  Who is the provider or what is the name of the office in which the patient attends annual eye exams? Dr.Wynne If pt is not established with a provider, would they like to be referred to a provider to establish care? No .   Dental Screening: Recommended annual dental exams for proper oral hygiene  Diabetic Foot Exam: Diabetic Foot Exam: Completed 07/01/22  Community Resource Referral / Chronic Care Management: CRR required this visit?  No   CCM required this visit?  No     Plan:     I have personally reviewed and noted the following in the patient's chart:   Medical and social history Use of alcohol, tobacco or illicit  drugs  Current medications and supplements including opioid prescriptions. Patient is not currently taking opioid prescriptions. Functional ability and status Nutritional status Physical activity Advanced directives List of other physicians Hospitalizations, surgeries, and ER visits in previous 12 months Vitals Screenings to include cognitive, depression, and falls Referrals and appointments  In addition, I have reviewed and discussed with patient certain preventive protocols, quality metrics, and best practice recommendations. A written personalized care plan for preventive services as well as general preventive health recommendations were provided to patient.     Hal Hope, LPN   16/06/958   After Visit Summary: (MyChart) Due to this being a telephonic visit, the after visit summary with patients personalized plan was offered to patient via MyChart   Nurse Notes: none

## 2023-03-20 NOTE — Patient Instructions (Addendum)
Todd Parks , Thank you for taking time to come for your Medicare Wellness Visit. I appreciate your ongoing commitment to your health goals. Please review the following plan we discussed and let me know if I can assist you in the future.   Referrals/Orders/Follow-Ups/Clinician Recommendations: none  This is a list of the screening recommended for you and due dates:  Health Maintenance  Topic Date Due   Flu Shot  01/16/2023   Complete foot exam   07/02/2023   Hemoglobin A1C  08/08/2023   Eye exam for diabetics  09/05/2023   Yearly kidney function blood test for diabetes  02/05/2024   Yearly kidney health urinalysis for diabetes  02/05/2024   Medicare Annual Wellness Visit  03/19/2024   Colon Cancer Screening  11/30/2029   DTaP/Tdap/Td vaccine (3 - Td or Tdap) 07/01/2032   Pneumonia Vaccine  Completed   Hepatitis C Screening  Completed   Zoster (Shingles) Vaccine  Completed   HPV Vaccine  Aged Out   COVID-19 Vaccine  Discontinued    Advanced directives: (ACP Link)Information on Advanced Care Planning can be found at [facility] Advance Health Care Directives Advance Health Care Directives ([email or web address]   Next Medicare Annual Wellness Visit scheduled for next year: Yes   03/23/24 @ 9:40 am by video

## 2023-03-27 DIAGNOSIS — G4733 Obstructive sleep apnea (adult) (pediatric): Secondary | ICD-10-CM | POA: Diagnosis not present

## 2023-04-08 ENCOUNTER — Other Ambulatory Visit (HOSPITAL_BASED_OUTPATIENT_CLINIC_OR_DEPARTMENT_OTHER): Payer: Medicare PPO

## 2023-04-26 ENCOUNTER — Other Ambulatory Visit: Payer: Self-pay | Admitting: Family Medicine

## 2023-04-27 DIAGNOSIS — G4733 Obstructive sleep apnea (adult) (pediatric): Secondary | ICD-10-CM | POA: Diagnosis not present

## 2023-05-10 ENCOUNTER — Other Ambulatory Visit: Payer: Self-pay | Admitting: Family Medicine

## 2023-05-20 ENCOUNTER — Ambulatory Visit: Payer: Medicare PPO | Admitting: Podiatry

## 2023-05-20 DIAGNOSIS — E119 Type 2 diabetes mellitus without complications: Secondary | ICD-10-CM | POA: Diagnosis not present

## 2023-05-20 DIAGNOSIS — B351 Tinea unguium: Secondary | ICD-10-CM | POA: Diagnosis not present

## 2023-05-20 DIAGNOSIS — M79675 Pain in left toe(s): Secondary | ICD-10-CM

## 2023-05-20 DIAGNOSIS — M79674 Pain in right toe(s): Secondary | ICD-10-CM

## 2023-05-21 NOTE — Progress Notes (Signed)
Subjective: No chief complaint on file.   72 y.o. returns the office today for painful, elongated, thickened toenails which he cannot trim himself.  He has no other concerns.  Otherwise he states his feet have been doing great.  Does not report any open lesions.  Sharlene Dory, DO Last seen on February 05, 2023  A1c: 6 on 02/05/2023  Objective: AAO 3, NAD DP/PT pulses palpable, CRT less than 3 seconds Protective sensation decreased with Dorann Ou monofilament Nails hypertrophic, dystrophic, elongated, brittle, discolored 10. There is tenderness overlying the nails 1-5 bilaterally. There is no surrounding erythema or drainage along the nail sites. Mild interdigital maceration noted without any drainage or signs of infection.  No pain with calf compression, swelling, warmth, erythema.  Assessment: Patient presents with symptomatic onychomycosis  Plan: -Treatment options including alternatives, risks, complications were discussed -Nails sharply debrided 10 without complication/bleeding. -Continue moisturizer for the dry skin but do not apply interdigitally.  In between the toes clean and dry. -Discussed daily foot inspection. If there are any changes, to call the office immediately. -Follow-up in 3 months or sooner if any problems are to arise. In the meantime, encouraged to call the office with any questions, concerns, changes symptoms.  Ovid Curd, DPM

## 2023-05-27 DIAGNOSIS — G4733 Obstructive sleep apnea (adult) (pediatric): Secondary | ICD-10-CM | POA: Diagnosis not present

## 2023-05-29 DIAGNOSIS — G4733 Obstructive sleep apnea (adult) (pediatric): Secondary | ICD-10-CM | POA: Diagnosis not present

## 2023-06-16 ENCOUNTER — Ambulatory Visit: Payer: Medicare PPO | Admitting: Nurse Practitioner

## 2023-06-17 DIAGNOSIS — L821 Other seborrheic keratosis: Secondary | ICD-10-CM | POA: Diagnosis not present

## 2023-06-17 DIAGNOSIS — D2262 Melanocytic nevi of left upper limb, including shoulder: Secondary | ICD-10-CM | POA: Diagnosis not present

## 2023-06-17 DIAGNOSIS — Z85828 Personal history of other malignant neoplasm of skin: Secondary | ICD-10-CM | POA: Diagnosis not present

## 2023-06-17 DIAGNOSIS — L814 Other melanin hyperpigmentation: Secondary | ICD-10-CM | POA: Diagnosis not present

## 2023-06-17 DIAGNOSIS — D485 Neoplasm of uncertain behavior of skin: Secondary | ICD-10-CM | POA: Diagnosis not present

## 2023-06-17 DIAGNOSIS — L579 Skin changes due to chronic exposure to nonionizing radiation, unspecified: Secondary | ICD-10-CM | POA: Diagnosis not present

## 2023-06-17 DIAGNOSIS — C44519 Basal cell carcinoma of skin of other part of trunk: Secondary | ICD-10-CM | POA: Diagnosis not present

## 2023-06-26 DIAGNOSIS — E119 Type 2 diabetes mellitus without complications: Secondary | ICD-10-CM | POA: Diagnosis not present

## 2023-06-26 DIAGNOSIS — H25813 Combined forms of age-related cataract, bilateral: Secondary | ICD-10-CM | POA: Diagnosis not present

## 2023-06-26 DIAGNOSIS — H40023 Open angle with borderline findings, high risk, bilateral: Secondary | ICD-10-CM | POA: Diagnosis not present

## 2023-06-26 DIAGNOSIS — H04123 Dry eye syndrome of bilateral lacrimal glands: Secondary | ICD-10-CM | POA: Diagnosis not present

## 2023-06-27 DIAGNOSIS — G4733 Obstructive sleep apnea (adult) (pediatric): Secondary | ICD-10-CM | POA: Diagnosis not present

## 2023-07-02 ENCOUNTER — Ambulatory Visit: Payer: Medicare PPO | Admitting: Nurse Practitioner

## 2023-07-02 ENCOUNTER — Encounter: Payer: Self-pay | Admitting: Nurse Practitioner

## 2023-07-02 VITALS — BP 110/60 | HR 70 | Ht 68.0 in | Wt 202.4 lb

## 2023-07-02 DIAGNOSIS — E66811 Obesity, class 1: Secondary | ICD-10-CM

## 2023-07-02 DIAGNOSIS — G4733 Obstructive sleep apnea (adult) (pediatric): Secondary | ICD-10-CM | POA: Diagnosis not present

## 2023-07-02 NOTE — Patient Instructions (Addendum)
 Continue CPAP every night, minimum of 4-6 hours a night.  Change equipment as directed. Wash your tubing with warm soap and water daily, hang to dry. Wash humidifier portion weekly. Use bottled, distilled water and change daily   Be aware of reduced alertness and do not drive or operate heavy machinery if experiencing this or drowsiness.  Exercise encouraged, as tolerated.   Attend mask fitting at [facility] - they will contact you to schedule this   Follow up in 6 months with Dr. Gaynell Keeler or Gina Lagos, or sooner, if needed

## 2023-07-02 NOTE — Assessment & Plan Note (Signed)
BMI 30. Healthy weight loss encouraged.  

## 2023-07-02 NOTE — Assessment & Plan Note (Signed)
 Moderate OSA on CPAP. Excellent compliance and control. Receives benefit from use. Continues to have difficulties with mask fit - will replace order for mask desensitization at [facility]. Advised him to notify us  if he does not hear from scheduling. Aware of proper care/use. Safe driving practices reviewed.   Patient Instructions  Continue CPAP every night, minimum of 4-6 hours a night.  Change equipment as directed. Wash your tubing with warm soap and water daily, hang to dry. Wash humidifier portion weekly. Use bottled, distilled water and change daily   Be aware of reduced alertness and do not drive or operate heavy machinery if experiencing this or drowsiness.  Exercise encouraged, as tolerated.   Attend mask fitting at [facility] - they will contact you to schedule this   Follow up in 6 months with Dr. Gaynell Keeler or Gina Lagos, or sooner, if needed

## 2023-07-02 NOTE — Progress Notes (Signed)
 @Patient  ID: Todd Parks, male    DOB: 29-Jul-1950, 73 y.o.   MRN: [id number]  Chief Complaint  Patient presents with   Follow-up    OSA F/U visit    Referring provider: Jobe Mulder*  HPI: 73 year old male, never smoker followed for OSA on CPAP. Past medical history significant for ischemic heart disease, CAD, HTN, OSA on CPAP, allergic rhinitis, GERD, DM, BPH, HLD.   TEST/EVENTS:  07/2017 HST: AHI 19/h  03/17/2023: OV with Chaundra Abreu NP for sleep consult, referred by Dr. Gwenette Lennox.  He has a history of a for sleep apnea on CPAP for almost 20 years now.  Last sleep study was in 2019 with moderate OSA.  Without CPAP, he has loud snoring and daytime fatigue.  With CPAP, he sleeps well and feels energized the next day.  Wears his CPAP nightly.  Denies any drowsy driving, morning headaches, sleep parasomnia/paralysis.  No history of narcolepsy or cataplexy. Goes to bed between 8 to 11 PM.  Falls asleep quickly.  Wakes 2-3 times a night.  Gets up around 5:30 AM.  Weight has been stable over the last 2 years.  Currently on CPAP with set pressure 10 cm of water.  Does not wear any supplemental oxygen. He has a history of high blood pressure and diabetes, well-controlled on current medication regimen.  Does have a history of MI.  No history of stroke. He is a never smoker.  Does not drink alcohol.  No excessive caffeine intake.  Lives with his wife.  He retired.  No significant family history. [place] 6  07/02/2023: Today - follow up Patient presents today for follow up after getting new CPAP machine. CPAP is working well for him. He was also supposed to have a mask fitting but he missed this appt. He would still like to go do this because he has to pull his mask really tight and it indents in his face. Not having any issues with leaks. Sleeping well for the most part. No excessive daytime sleepiness. Denies drowsy driving.   08/65/7846-9/[id number]/9528: CPAP 10 cmH2O 29/30 days; 97% >4 hr; average use  8 hr 38 min Leaks 95th 14.9 AHI 1.9  No Known Allergies  Immunization History  Administered Date(s) Administered   Fluad Quad(high Dose 65+) 03/20/2020, 04/09/2021, 07/01/2022   Influenza, High Dose Seasonal PF 04/03/2016, 04/03/2017, 04/06/2018, 03/31/2019   PNEUMOCOCCAL CONJUGATE-20 07/21/2020   Pneumococcal Conjugate-13 04/03/2016   Pneumococcal Polysaccharide-23 04/03/2017   Tdap 06/17/2012, 07/01/2022   Zoster Recombinant(Shingrix) 04/01/2019, 07/02/2019   Zoster, Live 06/18/2011    Past Medical History:  Diagnosis Date   Diabetes mellitus without complication (HCC)    GERD (gastroesophageal reflux disease)    History of heart attack 2006   Hyperlipidemia    Hypertension     Tobacco History: Social History   Tobacco Use  Smoking Status Never  Smokeless Tobacco Never   Counseling given: Not Answered   Outpatient Medications Prior to Visit  Medication Sig Dispense Refill   ACCU-CHEK AVIVA PLUS test strip Use daily to check blood sugar.  DXE11.9 100 each 3   Accu-Chek Softclix Lancets lancets Use daily to check blood sugar..  DX E11.9 100 each 3   aspirin EC 81 MG tablet Take by mouth.     BD PEN NEEDLE NANO 2ND GEN 32G X 4 MM MISC USE ONCE DAILY WITH LEVEMIR  PEN 100 each 1   Blood Glucose Monitoring Suppl (FIFTY50 GLUCOSE METER 2.0) w/Device KIT Use as instructed  Cholecalciferol (VITAMIN D3) 50 MCG (2000 UT) capsule      Clobetasol Propionate 0.05 % shampoo APPLY TO AFFECTED AREA EVERY DAY  3   INVOKANA 300 MG TABS tablet TAKE 1 TABLET BY MOUTH EVERY DAY 90 tablet 2   metFORMIN  (GLUCOPHAGE ) 1000 MG tablet TAKE 1 TABLET (1,000 MG TOTAL) BY MOUTH TWICE A DAY WITH MEALS 180 tablet 1   metoprolol succinate (TOPROL-XL) 100 MG 24 hr tablet TAKE 1 TABLET BY MOUTH EVERY DAY 90 tablet 3   montelukast  (SINGULAIR ) 10 MG tablet TAKE 1 TABLET BY MOUTH DAILY DURING POLLEN AND MOLD SEASON 90 tablet 1   nitroGLYCERIN (NITROSTAT) 0.4 MG SL tablet Place under the tongue.      omeprazole (PRILOSEC) 20 MG capsule TAKE 1 CAPSULE BY MOUTH EVERY DAY 90 capsule 2   rosuvastatin  (CRESTOR ) 20 MG tablet Take 1 tablet (20 mg total) by mouth daily. 90 tablet 3   TRESIBA  FLEXTOUCH 100 UNIT/ML FlexTouch Pen INJECT 35 UNITS INTO THE SKIN DAILY. 31.5 mL 0   valsartan (DIOVAN) 160 MG tablet TAKE 1 TABLET BY MOUTH EVERY DAY 90 tablet 2   fenofibrate  (TRICOR ) 145 MG tablet Take 1 tablet (145 mg total) by mouth daily. 90 tablet 0   No facility-administered medications prior to visit.     Review of Systems:   Constitutional: No weight loss or gain, night sweats, fevers, chills, fatigue, or lassitude. HEENT: No headaches, difficulty swallowing, tooth/dental problems, or sore throat. No sneezing, itching, ear ache, nasal congestion, or post nasal drip CV:  No chest pain, orthopnea, PND, swelling in lower extremities, anasarca, dizziness, palpitations, syncope Resp: No shortness of breath with exertion or at rest. No excess mucus or change in color of mucus. No productive or non-productive. No hemoptysis. No wheezing.  No chest wall deformity GI:  +heartburn, indigestion (well controlled on PPI). abdominal pain, nausea, vomiting, diarrhea, change in bowel habits, loss of appetite, bloody stools.  GU: No nocturia Skin: No rash, lesions, ulcerations MSK:  No joint pain or swelling.   Neuro: No dizziness or lightheadedness.  Psych: No depression or anxiety. Mood stable.     Physical Exam:  BP 110/60 (BP Location: Right Arm, Patient Position: Sitting, Cuff Size: Large)   Pulse 70   Ht 5\' 8"  (1.727 m)   Wt 202 lb 6.4 oz (91.8 kg)   SpO2 97%   BMI 30.77 kg/m   GEN: Pleasant, interactive, well-appearing; obese; in no acute distress HEENT:  Normocephalic and atraumatic. PERRLA. Sclera white. Nasal turbinates pink, moist and patent bilaterally. No rhinorrhea present. Oropharynx pink and moist, without exudate or edema. No lesions, ulcerations, or postnasal drip. Mallampati III NECK:   Supple w/ fair ROM. No JVD present. Normal carotid impulses w/o bruits. Thyroid symmetrical with no goiter or nodules palpated. No lymphadenopathy.   CV: RRR, no m/r/g, no peripheral edema. Pulses intact, +2 bilaterally. No cyanosis, pallor or clubbing. PULMONARY:  Unlabored, regular breathing. Clear bilaterally A&P w/o wheezes/rales/rhonchi. No accessory muscle use.  GI: BS present and normoactive. Soft, non-tender to palpation. No organomegaly or masses detected.  MSK: No erythema, warmth or tenderness. Cap refil <2 sec all extrem. No deformities or joint swelling noted.  Neuro: A/Ox3. No focal deficits noted.   Skin: Warm, no lesions or rashe Psych: Normal affect and behavior. Judgement and thought content appropriate.     Lab Results:  CBC    Component Value Date/Time   WBC 5.5 07/01/2022 0904   RBC 4.77 07/01/2022 0904  HGB 14.0 07/01/2022 0904   HCT 41.7 07/01/2022 0904   PLT 202.0 07/01/2022 0904   MCV 87.5 07/01/2022 0904   MCH 29.6 05/18/2019 0836   MCHC 33.6 07/01/2022 0904   RDW 13.5 07/01/2022 0904   LYMPHSABS 1,803 05/18/2019 0836   EOSABS 108 05/18/2019 0836   BASOSABS 39 05/18/2019 0836    BMET    Component Value Date/Time   NA 138 02/05/2023 1007   K 4.2 02/05/2023 1007   CL 102 02/05/2023 1007   CO2 29 02/05/2023 1007   GLUCOSE 79 02/05/2023 1007   BUN 21 02/05/2023 1007   CREATININE 1.30 02/05/2023 1007   CREATININE 1.31 (H) 03/20/2020 1403   CALCIUM  9.4 02/05/2023 1007   GFRNONAA 57 (L) 05/18/2019 0836   GFRAA 66 05/18/2019 0836    BNP No results found for: "BNP"   Imaging:  No results found.  Administration History     None           No data to display          No results found for: "NITRICOXIDE"      Assessment & Plan:   OSA (obstructive sleep apnea) Moderate OSA on CPAP. Excellent compliance and control. Receives benefit from use. Continues to have difficulties with mask fit - will replace order for mask  desensitization at [facility]. Advised him to notify us  if he does not hear from scheduling. Aware of proper care/use. Safe driving practices reviewed.   Patient Instructions  Continue CPAP every night, minimum of 4-6 hours a night.  Change equipment as directed. Wash your tubing with warm soap and water daily, hang to dry. Wash humidifier portion weekly. Use bottled, distilled water and change daily   Be aware of reduced alertness and do not drive or operate heavy machinery if experiencing this or drowsiness.  Exercise encouraged, as tolerated.   Attend mask fitting at [facility] - they will contact you to schedule this   Follow up in 6 months with Dr. Gaynell Keeler or Gina Lagos, or sooner, if needed    Obesity (BMI 30.0-34.9) BMI 30. Healthy weight loss encouraged.   I spent 25 minutes of dedicated to the care of this patient on the date of this encounter to include pre-visit review of records, face-to-face time with the patient discussing conditions above, post visit ordering of testing, clinical documentation with the electronic health record, making appropriate referrals as documented, and communicating necessary findings to members of the patients care team.  Roetta Clarke, NP 07/02/2023  Pt aware and understands NP's role.

## 2023-07-06 ENCOUNTER — Other Ambulatory Visit: Payer: Self-pay | Admitting: Family Medicine

## 2023-07-09 ENCOUNTER — Ambulatory Visit (HOSPITAL_BASED_OUTPATIENT_CLINIC_OR_DEPARTMENT_OTHER): Payer: Medicare PPO | Attending: Nurse Practitioner

## 2023-07-09 DIAGNOSIS — G4733 Obstructive sleep apnea (adult) (pediatric): Secondary | ICD-10-CM

## 2023-07-22 ENCOUNTER — Other Ambulatory Visit: Payer: Self-pay | Admitting: Family Medicine

## 2023-07-28 DIAGNOSIS — C44519 Basal cell carcinoma of skin of other part of trunk: Secondary | ICD-10-CM | POA: Diagnosis not present

## 2023-07-28 DIAGNOSIS — G4733 Obstructive sleep apnea (adult) (pediatric): Secondary | ICD-10-CM | POA: Diagnosis not present

## 2023-07-28 DIAGNOSIS — L905 Scar conditions and fibrosis of skin: Secondary | ICD-10-CM | POA: Diagnosis not present

## 2023-08-10 ENCOUNTER — Other Ambulatory Visit: Payer: Self-pay | Admitting: Family Medicine

## 2023-08-11 ENCOUNTER — Ambulatory Visit (INDEPENDENT_AMBULATORY_CARE_PROVIDER_SITE_OTHER): Payer: Medicare PPO | Admitting: Family Medicine

## 2023-08-11 ENCOUNTER — Encounter: Payer: Self-pay | Admitting: Family Medicine

## 2023-08-11 VITALS — BP 116/62 | HR 61 | Temp 98.0°F | Resp 16 | Ht 68.0 in | Wt 199.0 lb

## 2023-08-11 DIAGNOSIS — Z0000 Encounter for general adult medical examination without abnormal findings: Secondary | ICD-10-CM | POA: Diagnosis not present

## 2023-08-11 DIAGNOSIS — E669 Obesity, unspecified: Secondary | ICD-10-CM | POA: Diagnosis not present

## 2023-08-11 DIAGNOSIS — E1169 Type 2 diabetes mellitus with other specified complication: Secondary | ICD-10-CM

## 2023-08-11 DIAGNOSIS — Z7984 Long term (current) use of oral hypoglycemic drugs: Secondary | ICD-10-CM

## 2023-08-11 LAB — COMPREHENSIVE METABOLIC PANEL
ALT: 22 U/L (ref 0–53)
AST: 26 U/L (ref 0–37)
Albumin: 4.6 g/dL (ref 3.5–5.2)
Alkaline Phosphatase: 28 U/L — ABNORMAL LOW (ref 39–117)
BUN: 22 mg/dL (ref 6–23)
CO2: 28 meq/L (ref 19–32)
Calcium: 9.2 mg/dL (ref 8.4–10.5)
Chloride: 104 meq/L (ref 96–112)
Creatinine, Ser: 1.3 mg/dL (ref 0.40–1.50)
GFR: 54.73 mL/min — ABNORMAL LOW (ref >60.00)
Glucose, Bld: 75 mg/dL (ref 70–99)
Potassium: 4 meq/L (ref 3.5–5.1)
Sodium: 142 meq/L (ref 135–145)
Total Bilirubin: 1 mg/dL (ref 0.2–1.2)
Total Protein: 7.2 g/dL (ref 6.0–8.3)

## 2023-08-11 LAB — CBC
HCT: 45.2 % (ref 39.0–52.0)
Hemoglobin: 14.9 g/dL (ref 13.0–17.0)
MCHC: 33.1 g/dL (ref 30.0–36.0)
MCV: 89.9 fl (ref 78.0–100.0)
Platelets: 200 10*3/uL (ref 150.0–400.0)
RBC: 5.02 Mil/uL (ref 4.22–5.81)
RDW: 13.9 % (ref 11.5–15.5)
WBC: 6.6 10*3/uL (ref 4.0–10.5)

## 2023-08-11 LAB — LIPID PANEL
Cholesterol: 137 mg/dL (ref 0–200)
HDL: 36.5 mg/dL — ABNORMAL LOW (ref >39.00)
LDL Cholesterol: 69 mg/dL (ref 0–99)
NonHDL: 100.36
Total CHOL/HDL Ratio: 4
Triglycerides: 155 mg/dL — ABNORMAL HIGH (ref 0.0–149.0)
VLDL: 31 mg/dL (ref 0.0–40.0)

## 2023-08-11 LAB — HEMOGLOBIN A1C: Hgb A1c MFr Bld: 6.2 % (ref 4.6–6.5)

## 2023-08-11 MED ORDER — KETOCONAZOLE 2 % EX CREA
1.0000 | TOPICAL_CREAM | Freq: Every day | CUTANEOUS | 0 refills | Status: AC
Start: 2023-08-11 — End: 2023-09-22

## 2023-08-11 NOTE — Progress Notes (Signed)
 Chief Complaint  Patient presents with   Annual Exam    Annual Exam    Well Male Todd Parks is here for a complete physical.   His last physical was >1 year ago.  Current diet: in general, diet could be better.    Current exercise: none; active around house and in yard Weight trend: stable Fatigue out of ordinary? No. Seat belt? Yes.   Advanced directive? Yes  Health maintenance Shingrix- Yes Colonoscopy- Yes Tetanus- Yes Hep C- Yes Pneumonia vaccine- Yes  Past Medical History:  Diagnosis Date   Diabetes mellitus without complication (HCC)    GERD (gastroesophageal reflux disease)    History of heart attack 2006   Hyperlipidemia    Hypertension      Past Surgical History:  Procedure Laterality Date   BREAST SURGERY     mass   CARPAL TUNNEL RELEASE Bilateral    ROTATOR CUFF REPAIR Bilateral    VASECTOMY      Medications  Current Outpatient Medications on File Prior to Visit  Medication Sig Dispense Refill   ACCU-CHEK AVIVA PLUS test strip Use daily to check blood sugar.  DXE11.9 100 each 3   Accu-Chek Softclix Lancets lancets Use daily to check blood sugar..  DX E11.9 100 each 3   aspirin EC 81 MG tablet Take by mouth.     BD PEN NEEDLE NANO 2ND GEN 32G X 4 MM MISC USE ONCE DAILY WITH LEVEMIR PEN 100 each 1   Blood Glucose Monitoring Suppl (FIFTY50 GLUCOSE METER 2.0) w/Device KIT Use as instructed     Cholecalciferol (VITAMIN D3) 50 MCG (2000 UT) capsule      Clobetasol Propionate 0.05 % shampoo APPLY TO AFFECTED AREA EVERY DAY  3   INVOKANA 300 MG TABS tablet TAKE 1 TABLET BY MOUTH EVERY DAY 90 tablet 2   metFORMIN (GLUCOPHAGE) 1000 MG tablet TAKE 1 TABLET (1,000 MG TOTAL) BY MOUTH TWICE A DAY WITH MEALS 180 tablet 1   metoprolol succinate (TOPROL-XL) 100 MG 24 hr tablet TAKE 1 TABLET BY MOUTH EVERY DAY 90 tablet 3   montelukast (SINGULAIR) 10 MG tablet TAKE 1 TABLET BY MOUTH DAILY DURING POLLEN AND MOLD SEASON 90 tablet 1   nitroGLYCERIN (NITROSTAT) 0.4 MG SL  tablet Place under the tongue.     omeprazole (PRILOSEC) 20 MG capsule TAKE 1 CAPSULE BY MOUTH EVERY DAY 90 capsule 2   rosuvastatin (CRESTOR) 20 MG tablet Take 1 tablet (20 mg total) by mouth daily. 90 tablet 3   TRESIBA FLEXTOUCH 100 UNIT/ML FlexTouch Pen INJECT 35 UNITS INTO THE SKIN DAILY. 100 mL 1   valsartan (DIOVAN) 160 MG tablet TAKE 1 TABLET BY MOUTH EVERY DAY 90 tablet 2   fenofibrate (TRICOR) 145 MG tablet Take 1 tablet (145 mg total) by mouth daily. 90 tablet 0   No current facility-administered medications on file prior to visit.     Allergies No Known Allergies  Family History Family History  Problem Relation Age of Onset   Cancer Neg Hx     Review of Systems: Constitutional:  no fevers Eye:  no recent significant change in vision Ears:  No changes in hearing Nose/Mouth/Throat:  no complaints of nasal congestion, no sore throat Cardiovascular: no chest pain Respiratory:  No shortness of breath Gastrointestinal:  No change in bowel habits GU:  No frequency Integumentary:  no abnormal skin lesions reported Neurologic:  no headaches Endocrine:  denies unexplained weight changes  Exam BP 116/62 (BP Location: Left Arm,  Patient Position: Sitting)   Pulse 61   Temp 98 F (36.7 C) (Oral)   Resp 16   Ht 5\' 8"  (1.727 m)   Wt 199 lb (90.3 kg)   SpO2 100%   BMI 30.26 kg/m  General:  well developed, well nourished, in no apparent distress Skin: Cracking skin over the left great toe without surrounding erythema, fluctuance, drainage, there is also macerated tissue between digits 3/4 on the right foot.  Otherwise, no significant moles, warts, or growths on exposed skin Head:  no masses, lesions, or tenderness Eyes:  pupils equal and round, sclera anicteric without injection Ears:  canals without lesions, TMs shiny without retraction, no obvious effusion, no erythema Nose:  nares patent, mucosa normal Throat/Pharynx:  lips and gingiva without lesion; tongue and uvula  midline; non-inflamed pharynx; no exudates or postnasal drainage Lungs:  clear to auscultation, breath sounds equal bilaterally, no respiratory distress Cardio:  regular rate and rhythm, no LE edema or bruits Rectal: Deferred GI: BS+, S, NT, ND, no masses or organomegaly Musculoskeletal:  symmetrical muscle groups noted without atrophy or deformity Neuro: Sensation intact to pinprick bilaterally gait normal; deep tendon reflexes normal and symmetric Psych: well oriented with normal range of affect and appropriate judgment/insight  Assessment and Plan  Well adult exam  Type 2 diabetes mellitus with obesity (HCC) - Plan: CBC, Comprehensive metabolic panel, Hemoglobin A1c, Lipid panel   Well 73 y.o. male. Counseled on diet and exercise. Advanced directive form requested today. Ketoconazole for tinea pedis, 6 weeks.  Other orders as above. Follow up in 6 mo.  The patient voiced understanding and agreement to the plan.  Jilda Roche [place], DO 08/11/23 8:25 AM

## 2023-08-11 NOTE — Patient Instructions (Signed)
 Give [place] 2-3 business days to get the results of your labs back.   Keep the diet clean and stay active.  Please get me a copy of your advanced directive form at your convenience.   Let us know if you need anything.

## 2023-08-14 ENCOUNTER — Other Ambulatory Visit: Payer: Self-pay | Admitting: Family Medicine

## 2023-08-14 DIAGNOSIS — J301 Allergic rhinitis due to pollen: Secondary | ICD-10-CM

## 2023-08-17 ENCOUNTER — Other Ambulatory Visit: Payer: Self-pay | Admitting: Family Medicine

## 2023-08-18 ENCOUNTER — Encounter: Payer: Self-pay | Admitting: Podiatry

## 2023-08-18 ENCOUNTER — Ambulatory Visit: Payer: Medicare PPO | Admitting: Podiatry

## 2023-08-18 ENCOUNTER — Encounter: Payer: Self-pay | Admitting: Family Medicine

## 2023-08-18 DIAGNOSIS — M79674 Pain in right toe(s): Secondary | ICD-10-CM

## 2023-08-18 DIAGNOSIS — M79675 Pain in left toe(s): Secondary | ICD-10-CM

## 2023-08-18 DIAGNOSIS — E119 Type 2 diabetes mellitus without complications: Secondary | ICD-10-CM | POA: Diagnosis not present

## 2023-08-18 DIAGNOSIS — B351 Tinea unguium: Secondary | ICD-10-CM | POA: Diagnosis not present

## 2023-08-18 NOTE — Progress Notes (Signed)
 Subjective: Chief Complaint  Patient presents with   [facility]    RM#12 [facility]   73 y.o. returns the office today for painful, elongated, thickened toenails which he cannot trim himself.  He has no other concerns. No open lesions.   Sharlene Dory, DO Last seen on February 05, 2023  A1c: 6.2 08/11/2023  Objective: AAO 3, NAD DP/PT pulses palpable, CRT less than 3 seconds Protective sensation decreased with Simms Weinstein monofilament Nails hypertrophic, dystrophic, elongated, brittle, discolored 10. There is tenderness overlying the nails 1-5 bilaterally. There is no surrounding erythema or drainage along the nail sites. Mild interdigital maceration noted without any drainage or signs of infection.  No pain with calf compression, swelling, warmth, erythema.  Assessment: Patient presents with symptomatic onychomycosis  Plan: -Treatment options including alternatives, risks, complications were discussed -Nails sharply debrided 10 without complication/bleeding. -Continue moisturizer for the dry skin daily -Follow-up in 3 months or sooner if any problems are to arise. In the meantime, encouraged to call the office with any questions, concerns, changes symptoms.  Return in about 3 months (around 11/18/2023).  Ovid Curd, DPM

## 2023-08-25 DIAGNOSIS — G4733 Obstructive sleep apnea (adult) (pediatric): Secondary | ICD-10-CM | POA: Diagnosis not present

## 2023-08-28 DIAGNOSIS — G4733 Obstructive sleep apnea (adult) (pediatric): Secondary | ICD-10-CM | POA: Diagnosis not present

## 2023-09-09 LAB — HM DIABETES EYE EXAM

## 2023-09-25 DIAGNOSIS — G4733 Obstructive sleep apnea (adult) (pediatric): Secondary | ICD-10-CM | POA: Diagnosis not present

## 2023-10-05 ENCOUNTER — Other Ambulatory Visit: Payer: Self-pay | Admitting: Family Medicine

## 2023-10-23 DIAGNOSIS — E119 Type 2 diabetes mellitus without complications: Secondary | ICD-10-CM | POA: Diagnosis not present

## 2023-10-23 DIAGNOSIS — H40023 Open angle with borderline findings, high risk, bilateral: Secondary | ICD-10-CM | POA: Diagnosis not present

## 2023-10-23 DIAGNOSIS — H25813 Combined forms of age-related cataract, bilateral: Secondary | ICD-10-CM | POA: Diagnosis not present

## 2023-10-23 DIAGNOSIS — Z794 Long term (current) use of insulin: Secondary | ICD-10-CM | POA: Diagnosis not present

## 2023-10-23 DIAGNOSIS — H04123 Dry eye syndrome of bilateral lacrimal glands: Secondary | ICD-10-CM | POA: Diagnosis not present

## 2023-10-25 DIAGNOSIS — G4733 Obstructive sleep apnea (adult) (pediatric): Secondary | ICD-10-CM | POA: Diagnosis not present

## 2023-11-18 ENCOUNTER — Ambulatory Visit: Admitting: Podiatry

## 2023-11-18 DIAGNOSIS — B351 Tinea unguium: Secondary | ICD-10-CM

## 2023-11-18 DIAGNOSIS — M79675 Pain in left toe(s): Secondary | ICD-10-CM

## 2023-11-18 DIAGNOSIS — M79674 Pain in right toe(s): Secondary | ICD-10-CM

## 2023-11-18 NOTE — Progress Notes (Signed)
 Subjective: Chief Complaint  Patient presents with   [facility]    RM#11 [facility] patient states blood sugars under control.    73 y.o. returns the office today for painful, elongated, thickened toenails which he cannot trim himself.  He has no other concerns. No open lesions.   Jobe Mulder, DO Last seen on 08/11/2023  A1c: 6.2 08/11/2023  Objective: AAO 3, NAD DP/PT pulses palpable, CRT less than 3 seconds Protective sensation decreased with Simms Weinstein monofilament Nails hypertrophic, dystrophic, elongated, brittle, discolored 10. There is tenderness overlying the nails 1-5 bilaterally. There is no surrounding erythema or drainage along the nail sites. No pain with calf compression, swelling, warmth, erythema.  Assessment: Patient presents with symptomatic onychomycosis  Plan: -Treatment options including alternatives, risks, complications were discussed -Nails sharply debrided 10 without complication/bleeding. -Continue moisturizer for the dry skin daily -Follow-up in 3 months or sooner if any problems are to arise. In the meantime, encouraged to call the office with any questions, concerns, changes symptoms.  Return in about 3 months (around 02/18/2024).   Bobbie Burows, DPM

## 2023-11-25 DIAGNOSIS — G4733 Obstructive sleep apnea (adult) (pediatric): Secondary | ICD-10-CM | POA: Diagnosis not present

## 2023-11-26 DIAGNOSIS — G4733 Obstructive sleep apnea (adult) (pediatric): Secondary | ICD-10-CM | POA: Diagnosis not present

## 2023-12-09 ENCOUNTER — Encounter: Payer: Self-pay | Admitting: Family Medicine

## 2023-12-09 ENCOUNTER — Ambulatory Visit: Admitting: Family Medicine

## 2023-12-09 VITALS — BP 122/78 | HR 76 | Temp 98.0°F | Resp 16 | Ht 68.0 in | Wt 198.8 lb

## 2023-12-09 DIAGNOSIS — M545 Low back pain, unspecified: Secondary | ICD-10-CM | POA: Diagnosis not present

## 2023-12-09 MED ORDER — METHOCARBAMOL 500 MG PO TABS: 500.0000 mg | ORAL_TABLET | Freq: Three times a day (TID) | ORAL | 0 refills | Status: AC | PRN

## 2023-12-09 MED ORDER — METHYLPREDNISOLONE SODIUM SUCC 125 MG IJ SOLR
125.0000 mg | Freq: Once | INTRAMUSCULAR | Status: AC
  Administered 2023-12-09: 125 mg via INTRAVENOUS

## 2023-12-09 NOTE — Patient Instructions (Signed)
 If you do not hear anything about your referral in the next 1-2 weeks, call our office and ask for an update.  Heat (pad or rice pillow in microwave) over affected area, 10-15 minutes twice daily.   Ice/cold pack over area for 10-15 min twice daily.  Let us  know if you need anything.  EXERCISES  RANGE OF MOTION (ROM) AND STRETCHING EXERCISES - Low Back Pain Most people with lower back pain will find that their symptoms get worse with excessive bending forward (flexion) or arching at the lower back (extension). The exercises that will help resolve your symptoms will focus on the opposite motion.  If you have pain, numbness or tingling which travels down into your buttocks, leg or foot, the goal of the therapy is for these symptoms to move closer to your back and eventually resolve. Sometimes, these leg symptoms will get better, but your lower back pain may worsen. This is often an indication of progress in your rehabilitation. Be very alert to any changes in your symptoms and the activities in which you participated in the 24 hours prior to the change. Sharing this information with your caregiver will allow him or her to most efficiently treat your condition. These exercises may help you when beginning to rehabilitate your injury. Your symptoms may resolve with or without further involvement from your physician, physical therapist or athletic trainer. While completing these exercises, remember:  Restoring tissue flexibility helps normal motion to return to the joints. This allows healthier, less painful movement and activity. An effective stretch should be held for at least 30 seconds. A stretch should never be painful. You should only feel a gentle lengthening or release in the stretched tissue. FLEXION RANGE OF MOTION AND STRETCHING EXERCISES:  STRETCH - Flexion, Single Knee to Chest  Lie on a firm bed or floor with both legs extended in front of you. Keeping one leg in contact with the floor,  bring your opposite knee to your chest. Hold your leg in place by either grabbing behind your thigh or at your knee. Pull until you feel a gentle stretch in your low back. Hold 30 seconds. Slowly release your grasp and repeat the exercise with the opposite side. Repeat 2 times. Complete this exercise 3 times per week.   STRETCH - Flexion, Double Knee to Chest Lie on a firm bed or floor with both legs extended in front of you. Keeping one leg in contact with the floor, bring your opposite knee to your chest. Tense your stomach muscles to support your back and then lift your other knee to your chest. Hold your legs in place by either grabbing behind your thighs or at your knees. Pull both knees toward your chest until you feel a gentle stretch in your low back. Hold 30 seconds. Tense your stomach muscles and slowly return one leg at a time to the floor. Repeat 2 times. Complete this exercise 3 times per week.   STRETCH - Low Trunk Rotation Lie on a firm bed or floor. Keeping your legs in front of you, bend your knees so they are both pointed toward the ceiling and your feet are flat on the floor. Extend your arms out to the side. This will stabilize your upper body by keeping your shoulders in contact with the floor. Gently and slowly drop both knees together to one side until you feel a gentle stretch in your low back. Hold for 30 seconds. Tense your stomach muscles to support your lower back  as you bring your knees back to the starting position. Repeat the exercise to the other side. Repeat 2 times. Complete this exercise at least 3 times per week.   EXTENSION RANGE OF MOTION AND FLEXIBILITY EXERCISES:  STRETCH - Extension, Prone on Elbows  Lie on your stomach on the floor, a bed will be too soft. Place your palms about shoulder width apart and at the height of your head. Place your elbows under your shoulders. If this is too painful, stack pillows under your chest. Allow your body to relax  so that your hips drop lower and make contact more completely with the floor. Hold this position for 30 seconds. Slowly return to lying flat on the floor. Repeat 2 times. Complete this exercise 3 times per week.   RANGE OF MOTION - Extension, Prone Press Ups Lie on your stomach on the floor, a bed will be too soft. Place your palms about shoulder width apart and at the height of your head. Keeping your back as relaxed as possible, slowly straighten your elbows while keeping your hips on the floor. You may adjust the placement of your hands to maximize your comfort. As you gain motion, your hands will come more underneath your shoulders. Hold this position 30 seconds. Slowly return to lying flat on the floor. Repeat 2 times. Complete this exercise 3 times per week.   RANGE OF MOTION- Quadruped, Neutral Spine  Assume a hands and knees position on a firm surface. Keep your hands under your shoulders and your knees under your hips. You may place padding under your knees for comfort. Drop your head and point your tailbone toward the ground below you. This will round out your lower back like an angry cat. Hold this position for 30 seconds. Slowly lift your head and release your tail bone so that your back sags into a large arch, like an old horse. Hold this position for 30 seconds. Repeat this until you feel limber in your low back. Now, find your sweet spot. This will be the most comfortable position somewhere between the two previous positions. This is your neutral spine. Once you have found this position, tense your stomach muscles to support your low back. Hold this position for 30 seconds. Repeat 2 times. Complete this exercise 3 times per week.   STRENGTHENING EXERCISES - Low Back Sprain These exercises may help you when beginning to rehabilitate your injury. These exercises should be done near your sweet spot. This is the neutral, low-back arch, somewhere between fully rounded and fully  arched, that is your least painful position. When performed in this safe range of motion, these exercises can be used for people who have either a flexion or extension based injury. These exercises may resolve your symptoms with or without further involvement from your physician, physical therapist or athletic trainer. While completing these exercises, remember:  Muscles can gain both the endurance and the strength needed for everyday activities through controlled exercises. Complete these exercises as instructed by your physician, physical therapist or athletic trainer. Increase the resistance and repetitions only as guided. You may experience muscle soreness or fatigue, but the pain or discomfort you are trying to eliminate should never worsen during these exercises. If this pain does worsen, stop and make certain you are following the directions exactly. If the pain is still present after adjustments, discontinue the exercise until you can discuss the trouble with your caregiver.  STRENGTHENING - Deep Abdominals, Pelvic Tilt  Lie on a firm  bed or floor. Keeping your legs in front of you, bend your knees so they are both pointed toward the ceiling and your feet are flat on the floor. Tense your lower abdominal muscles to press your low back into the floor. This motion will rotate your pelvis so that your tail bone is scooping upwards rather than pointing at your feet or into the floor. With a gentle tension and even breathing, hold this position for 3 seconds. Repeat 2 times. Complete this exercise 3 times per week.   STRENGTHENING - Abdominals, Crunches  Lie on a firm bed or floor. Keeping your legs in front of you, bend your knees so they are both pointed toward the ceiling and your feet are flat on the floor. Cross your arms over your chest. Slightly tip your chin down without bending your neck. Tense your abdominals and slowly lift your trunk high enough to just clear your shoulder blades.  Lifting higher can put excessive stress on the lower back and does not further strengthen your abdominal muscles. Control your return to the starting position. Repeat 2 times. Complete this exercise 3 times per week.   STRENGTHENING - Quadruped, Opposite UE/LE Lift  Assume a hands and knees position on a firm surface. Keep your hands under your shoulders and your knees under your hips. You may place padding under your knees for comfort. Find your neutral spine and gently tense your abdominal muscles so that you can maintain this position. Your shoulders and hips should form a rectangle that is parallel with the floor and is not twisted. Keeping your trunk steady, lift your right hand no higher than your shoulder and then your left leg no higher than your hip. Make sure you are not holding your breath. Hold this position for 30 seconds. Continuing to keep your abdominal muscles tense and your back steady, slowly return to your starting position. Repeat with the opposite arm and leg. Repeat 2 times. Complete this exercise 3 times per week.   STRENGTHENING - Abdominals and Quadriceps, Straight Leg Raise  Lie on a firm bed or floor with both legs extended in front of you. Keeping one leg in contact with the floor, bend the other knee so that your foot can rest flat on the floor. Find your neutral spine, and tense your abdominal muscles to maintain your spinal position throughout the exercise. Slowly lift your straight leg off the floor about 6 inches for a count of 3, making sure to not hold your breath. Still keeping your neutral spine, slowly lower your leg all the way to the floor. Repeat this exercise with each leg 2 times. Complete this exercise 3 times per week.  POSTURE AND BODY MECHANICS CONSIDERATIONS - Low Back Sprain Keeping correct posture when sitting, standing or completing your activities will reduce the stress put on different body tissues, allowing injured tissues a chance to heal  and limiting painful experiences. The following are general guidelines for improved posture.  While reading these guidelines, remember: The exercises prescribed by your provider will help you have the flexibility and strength to maintain correct postures. The correct posture provides the best environment for your joints to work. All of your joints have less wear and tear when properly supported by a spine with good posture. This means you will experience a healthier, less painful body. Correct posture must be practiced with all of your activities, especially prolonged sitting and standing. Correct posture is as important when doing repetitive low-stress activities (typing) as  it is when doing a single heavy-load activity (lifting).  RESTING POSITIONS Consider which positions are most painful for you when choosing a resting position. If you have pain with flexion-based activities (sitting, bending, stooping, squatting), choose a position that allows you to rest in a less flexed posture. You would want to avoid curling into a fetal position on your side. If your pain worsens with extension-based activities (prolonged standing, working overhead), avoid resting in an extended position such as sleeping on your stomach. Most people will find more comfort when they rest with their spine in a more neutral position, neither too rounded nor too arched. Lying on a non-sagging bed on your side with a pillow between your knees, or on your back with a pillow under your knees will often provide some relief. Keep in mind, being in any one position for a prolonged period of time, no matter how correct your posture, can still lead to stiffness.  PROPER SITTING POSTURE In order to minimize stress and discomfort on your spine, you must sit with correct posture. Sitting with good posture should be effortless for a healthy body. Returning to good posture is a gradual process. Many people can work toward this most comfortably by  using various supports until they have the flexibility and strength to maintain this posture on their own. When sitting with proper posture, your ears will fall over your shoulders and your shoulders will fall over your hips. You should use the back of the chair to support your upper back. Your lower back will be in a neutral position, just slightly arched. You may place a small pillow or folded towel at the base of your lower back for  support.  When working at a desk, create an environment that supports good, upright posture. Without extra support, muscles tire, which leads to excessive strain on joints and other tissues. Keep these recommendations in mind:  CHAIR: A chair should be able to slide under your desk when your back makes contact with the back of the chair. This allows you to work closely. The chair's height should allow your eyes to be level with the upper part of your monitor and your hands to be slightly lower than your elbows.  BODY POSITION Your feet should make contact with the floor. If this is not possible, use a foot rest. Keep your ears over your shoulders. This will reduce stress on your neck and low back.  INCORRECT SITTING POSTURES  If you are feeling tired and unable to assume a healthy sitting posture, do not slouch or slump. This puts excessive strain on your back tissues, causing more damage and pain. Healthier options include: Using more support, like a lumbar pillow. Switching tasks to something that requires you to be upright or walking. Talking a brief walk. Lying down to rest in a neutral-spine position.  PROLONGED STANDING WHILE SLIGHTLY LEANING FORWARD  When completing a task that requires you to lean forward while standing in one place for a long time, place either foot up on a stationary 2-4 inch high object to help maintain the best posture. When both feet are on the ground, the lower back tends to lose its slight inward curve. If this curve flattens (or  becomes too large), then the back and your other joints will experience too much stress, tire more quickly, and can cause pain.  CORRECT STANDING POSTURES Proper standing posture should be assumed with all daily activities, even if they only take a few moments, like  when brushing your teeth. As in sitting, your ears should fall over your shoulders and your shoulders should fall over your hips. You should keep a slight tension in your abdominal muscles to brace your spine. Your tailbone should point down to the ground, not behind your body, resulting in an over-extended swayback posture.   INCORRECT STANDING POSTURES  Common incorrect standing postures include a forward head, locked knees and/or an excessive swayback. WALKING Walk with an upright posture. Your ears, shoulders and hips should all line-up.  PROLONGED ACTIVITY IN A FLEXED POSITION When completing a task that requires you to bend forward at your waist or lean over a low surface, try to find a way to stabilize 3 out of 4 of your limbs. You can place a hand or elbow on your thigh or rest a knee on the surface you are reaching across. This will provide you more stability, so that your muscles do not tire as quickly. By keeping your knees relaxed, or slightly bent, you will also reduce stress across your lower back. CORRECT LIFTING TECHNIQUES  DO : Assume a wide stance. This will provide you more stability and the opportunity to get as close as possible to the object which you are lifting. Tense your abdominals to brace your spine. Bend at the knees and hips. Keeping your back locked in a neutral-spine position, lift using your leg muscles. Lift with your legs, keeping your back straight. Test the weight of unknown objects before attempting to lift them. Try to keep your elbows locked down at your sides in order get the best strength from your shoulders when carrying an object.   Always ask for help when lifting heavy or awkward  objects. INCORRECT LIFTING TECHNIQUES DO NOT:  Lock your knees when lifting, even if it is a small object. Bend and twist. Pivot at your feet or move your feet when needing to change directions. Assume that you can safely pick up even a paperclip without proper posture.

## 2023-12-09 NOTE — Progress Notes (Signed)
 Musculoskeletal Exam  Patient: Todd Parks DOB: May 04, 1951  DOS: 12/09/2023  SUBJECTIVE:  Chief Complaint:   Chief Complaint  Patient presents with   Sciatica    Sciatica Pain    [name] is a 73 y.o.  male for evaluation and treatment of back pain.   Onset:  1 week ago.  No inj or change in activity.  Location: lower R radiating in to the buttock/thigh area Character:  aching  Progression of issue:  is unchanged Associated symptoms: difficulty standing for periods of time No bruising, redness, swelling, bowel/bladder incontinence or weakness Treatment: to date has been- Tylenol , some stretching.   Neurovascular symptoms: no  Past Medical History:  Diagnosis Date   Diabetes mellitus without complication (HCC)    GERD (gastroesophageal reflux disease)    History of heart attack 2006   Hyperlipidemia    Hypertension     Objective:  VITAL SIGNS: BP 122/78 (BP Location: Left Arm, Patient Position: Sitting)   Pulse 76   Temp 98 F (36.7 C) (Oral)   Resp 16   Ht 5' 8 (1.727 m)   Wt 198 lb 12.8 oz (90.2 kg)   SpO2 98%   BMI 30.23 kg/m  Constitutional: Well formed, well developed. No acute distress. HENT: Normocephalic, atraumatic.  Thorax & Lungs:  No accessory muscle use Musculoskeletal: low back.   Tenderness to palpation: Mild TTP over the lower right lumbar paraspinal musculature Deformity: no Ecchymosis: no Straight leg test: negative for Poor hamstring flexibility b/l. Neurologic: Normal sensory function. No focal deficits noted. DTR's equal and symmetric in LE's. No clonus.  5/5 strength in the lower extremities throughout. Psychiatric: Normal mood. Age appropriate judgment and insight. Alert & oriented x 3.    Assessment:  Acute right-sided low back pain, unspecified whether sciatica present - Plan: Ambulatory referral to Physical Therapy, methocarbamol (ROBAXIN) 500 MG tablet, methylPREDNISolone  sodium succinate (SOLU-MEDROL ) 125 mg/2 mL injection 125  mg  Plan: Stretches/exercises, heat, ice, Tylenol , Solu-Medrol  injection today 62.5 mg IM.  Nonsedating muscle relaxer as above.  Refer to physical therapy as he has done well with dry needling in the past. F/u prn. The patient voiced understanding and agreement to the plan.   Mabel [place], DO 12/09/23  11:57 AM

## 2023-12-23 DIAGNOSIS — L821 Other seborrheic keratosis: Secondary | ICD-10-CM | POA: Diagnosis not present

## 2023-12-23 DIAGNOSIS — D225 Melanocytic nevi of trunk: Secondary | ICD-10-CM | POA: Diagnosis not present

## 2023-12-23 DIAGNOSIS — L814 Other melanin hyperpigmentation: Secondary | ICD-10-CM | POA: Diagnosis not present

## 2023-12-23 DIAGNOSIS — L570 Actinic keratosis: Secondary | ICD-10-CM | POA: Diagnosis not present

## 2023-12-23 DIAGNOSIS — L579 Skin changes due to chronic exposure to nonionizing radiation, unspecified: Secondary | ICD-10-CM | POA: Diagnosis not present

## 2023-12-23 DIAGNOSIS — Z85828 Personal history of other malignant neoplasm of skin: Secondary | ICD-10-CM | POA: Diagnosis not present

## 2023-12-24 DIAGNOSIS — M2569 Stiffness of other specified joint, not elsewhere classified: Secondary | ICD-10-CM | POA: Diagnosis not present

## 2023-12-24 DIAGNOSIS — M6281 Muscle weakness (generalized): Secondary | ICD-10-CM | POA: Diagnosis not present

## 2023-12-24 DIAGNOSIS — M545 Low back pain, unspecified: Secondary | ICD-10-CM | POA: Diagnosis not present

## 2023-12-25 ENCOUNTER — Other Ambulatory Visit: Payer: Self-pay | Admitting: Student

## 2023-12-25 DIAGNOSIS — M47816 Spondylosis without myelopathy or radiculopathy, lumbar region: Secondary | ICD-10-CM

## 2023-12-25 DIAGNOSIS — G4733 Obstructive sleep apnea (adult) (pediatric): Secondary | ICD-10-CM | POA: Diagnosis not present

## 2023-12-26 ENCOUNTER — Encounter: Payer: Self-pay | Admitting: Student

## 2023-12-26 DIAGNOSIS — M545 Low back pain, unspecified: Secondary | ICD-10-CM | POA: Diagnosis not present

## 2023-12-26 DIAGNOSIS — M6281 Muscle weakness (generalized): Secondary | ICD-10-CM | POA: Diagnosis not present

## 2023-12-26 DIAGNOSIS — M2569 Stiffness of other specified joint, not elsewhere classified: Secondary | ICD-10-CM | POA: Diagnosis not present

## 2023-12-29 DIAGNOSIS — M2569 Stiffness of other specified joint, not elsewhere classified: Secondary | ICD-10-CM | POA: Diagnosis not present

## 2023-12-29 DIAGNOSIS — M6281 Muscle weakness (generalized): Secondary | ICD-10-CM | POA: Diagnosis not present

## 2023-12-29 DIAGNOSIS — M545 Low back pain, unspecified: Secondary | ICD-10-CM | POA: Diagnosis not present

## 2023-12-30 ENCOUNTER — Ambulatory Visit: Payer: Medicare PPO | Admitting: Nurse Practitioner

## 2023-12-30 ENCOUNTER — Encounter: Payer: Self-pay | Admitting: Nurse Practitioner

## 2023-12-30 VITALS — BP 110/78 | HR 63 | Ht 68.0 in | Wt 193.6 lb

## 2023-12-30 DIAGNOSIS — G4733 Obstructive sleep apnea (adult) (pediatric): Secondary | ICD-10-CM

## 2023-12-30 NOTE — Assessment & Plan Note (Signed)
 Moderate OSA on CPAP. Excellent compliance and control. Receives benefit from use. Aware of proper care/use of device. Understands risks of untreated OSA. Safe driving practices reviewed.  Patient Instructions  Continue to use CPAP every night, minimum of 4-6 hours a night.  Change equipment as directed. Wash your tubing with warm soap and water daily, hang to dry. Wash humidifier portion weekly. Use bottled, distilled water and change daily Be aware of reduced alertness and do not drive or operate heavy machinery if experiencing this or drowsiness.  Exercise encouraged, as tolerated. Healthy weight management discussed.  Avoid or decrease alcohol consumption and medications that make you more sleepy, if possible. Notify if persistent daytime sleepiness occurs even with consistent use of PAP therapy.  Change CPAP supplies... Every month Mask cushions and/or nasal pillows CPAP machine filters Every 3 months Mask frame (not including the headgear) CPAP tubing Every 6 months Mask headgear Chin strap (if applicable) Humidifier water tub  Follow up in one year with Katie Jamin Humphries,NP, or sooner, if needed

## 2023-12-30 NOTE — Patient Instructions (Signed)
 Continue to use CPAP every night, minimum of 4-6 hours a night.  Change equipment as directed. Wash your tubing with warm soap and water daily, hang to dry. Wash humidifier portion weekly. Use bottled, distilled water and change daily Be aware of reduced alertness and do not drive or operate heavy machinery if experiencing this or drowsiness.  Exercise encouraged, as tolerated. Healthy weight management discussed.  Avoid or decrease alcohol consumption and medications that make you more sleepy, if possible. Notify if persistent daytime sleepiness occurs even with consistent use of PAP therapy.  Change CPAP supplies... Every month Mask cushions and/or nasal pillows CPAP machine filters Every 3 months Mask frame (not including the headgear) CPAP tubing Every 6 months Mask headgear Chin strap (if applicable) Humidifier water tub  Follow up in one year with Katie Jonte Wollam,NP, or sooner, if needed

## 2023-12-30 NOTE — Progress Notes (Signed)
 @Patient  ID: Todd Parks, male    DOB: [birth year], 73 y.o.   MRN: [id number]  Chief Complaint  Patient presents with   Follow-up    Osa     Referring provider: Frann Mabel Mt*  HPI: 73 year old male, never smoker followed for OSA on CPAP. Past medical history significant for ischemic heart disease, CAD, HTN, OSA on CPAP, allergic rhinitis, GERD, DM, BPH, HLD.   TEST/EVENTS:  07/2017 HST: AHI 19/h  03/17/2023: OV with Stefania Goulart NP for sleep consult, referred by Dr. Frann.  He has a history of a for sleep apnea on CPAP for almost 20 years now.  Last sleep study was in 2019 with moderate OSA.  Without CPAP, he has loud snoring and daytime fatigue.  With CPAP, he sleeps well and feels energized the next day.  Wears his CPAP nightly.  Denies any drowsy driving, morning headaches, sleep parasomnia/paralysis.  No history of narcolepsy or cataplexy. Goes to bed between 8 to 11 PM.  Falls asleep quickly.  Wakes 2-3 times a night.  Gets up around 5:30 AM.  Weight has been stable over the last 2 years.  Currently on CPAP with set pressure 10 cm of water.  Does not wear any supplemental oxygen. He has a history of high blood pressure and diabetes, well-controlled on current medication regimen.  Does have a history of MI.  No history of stroke. He is a never smoker.  Does not drink alcohol.  No excessive caffeine intake.  Lives with his wife.  He retired.  No significant family history. [place] 6  07/02/2023: OV with Savior Himebaugh NP Patient presents today for follow up after getting new CPAP machine. CPAP is working well for him. He was also supposed to have a mask fitting but he missed this appt. He would still like to go do this because he has to pull his mask really tight and it indents in his face. Not having any issues with leaks. Sleeping well for the most part. No excessive daytime sleepiness. Denies drowsy driving.  87/83/7975-8/85/7974: CPAP 10 cmH2O 29/30 days; 97% >4 hr; average use 8 hr 38  min [place] [facility] 14.9 AHI 1.9  12/30/2023: Today - follow up Patient presents today for follow-up of OSA on CPAP.  Doing well.  No issues with his CPAP.  Feels well rested with use.  Denies any issues with drowsy driving or excessive daytime sleepiness.  He is having some trouble with sciatica.  Currently on prednisone  for this.  11/29/2023-12/28/2023: CPAP 10 cmH2O 28/30 days; 93% >4 hr; average use 8 hr 4 min [place] [facility] 10.3 AHI 1.7   No Known Allergies  Immunization History  Administered Date(s) Administered   Fluad Quad(high Dose 65+) 03/20/2020, 04/09/2021, 07/01/2022   Influenza, High Dose Seasonal PF 04/03/2016, 04/03/2017, 04/06/2018, 03/31/2019   PNEUMOCOCCAL CONJUGATE-20 07/21/2020   Pneumococcal Conjugate-13 04/03/2016   Pneumococcal Polysaccharide-23 04/03/2017   Tdap 06/17/2012, 07/01/2022   Zoster Recombinant(Shingrix) 04/01/2019, 07/02/2019   Zoster, Live 06/18/2011    Past Medical History:  Diagnosis Date   Diabetes mellitus without complication (HCC)    GERD (gastroesophageal reflux disease)    History of heart attack 2006   Hyperlipidemia    Hypertension     Tobacco History: Social History   Tobacco Use  Smoking Status Never  Smokeless Tobacco Never   Counseling given: Not Answered   Outpatient Medications Prior to Visit  Medication Sig Dispense Refill   ACCU-CHEK AVIVA PLUS test strip Use daily to check blood  sugar.  DXE11.9 100 each 3   Accu-Chek Softclix Lancets lancets Use daily to check blood sugar..  DX E11.9 100 each 3   aspirin EC 81 MG tablet Take by mouth.     BD PEN NEEDLE NANO 2ND GEN 32G X 4 MM MISC USE ONCE DAILY WITH LEVEMIR  PEN 100 each 1   Blood Glucose Monitoring Suppl (FIFTY50 GLUCOSE METER 2.0) w/Device KIT Use as instructed     Cholecalciferol (VITAMIN D3) 50 MCG (2000 UT) capsule      Clobetasol Propionate 0.05 % shampoo APPLY TO AFFECTED AREA EVERY DAY  3   fenofibrate  (TRICOR ) 145 MG tablet Take 1 tablet (145 mg total) by  mouth daily. 90 tablet 0   INVOKANA 300 MG TABS tablet TAKE 1 TABLET BY MOUTH EVERY DAY 90 tablet 2   metFORMIN  (GLUCOPHAGE ) 1000 MG tablet TAKE 1 TABLET (1,000 MG TOTAL) BY MOUTH TWICE A DAY WITH MEALS 180 tablet 1   methocarbamol  (ROBAXIN ) 500 MG tablet Take 1 tablet (500 mg total) by mouth every 8 (eight) hours as needed for muscle spasms. 21 tablet 0   metoprolol succinate (TOPROL-XL) 100 MG 24 hr tablet TAKE 1 TABLET BY MOUTH EVERY DAY 90 tablet 3   nitroGLYCERIN (NITROSTAT) 0.4 MG SL tablet Place under the tongue.     omeprazole (PRILOSEC) 20 MG capsule Take 1 capsule (20 mg total) by mouth daily. 90 capsule 2   rosuvastatin  (CRESTOR ) 20 MG tablet Take 1 tablet (20 mg total) by mouth daily. 90 tablet 3   TRESIBA  FLEXTOUCH 100 UNIT/ML FlexTouch Pen INJECT 35 UNITS INTO THE SKIN DAILY. 100 mL 1   valsartan (DIOVAN) 160 MG tablet TAKE 1 TABLET BY MOUTH EVERY DAY 90 tablet 2   No facility-administered medications prior to visit.     Review of Systems:   Constitutional: No weight loss or gain, night sweats, fevers, chills, fatigue, or lassitude. HEENT: No headaches, difficulty swallowing, tooth/dental problems, or sore throat. No sneezing, itching, ear ache, nasal congestion, or post nasal drip CV:  No chest pain, orthopnea, PND, swelling in lower extremities, anasarca, dizziness, palpitations, syncope Resp: No shortness of breath with exertion or at rest. No cough GI:  +heartburn, indigestion (well controlled on PPI). No abdominal pain, nausea, vomiting, diarrhea, change in bowel habits, loss of appetite, bloody stools.  GU: No nocturia Neuro: No memory impairment  Psych: No depression or anxiety. Mood stable.     Physical Exam:  BP 110/78 (BP Location: Left Arm, Patient Position: Sitting, Cuff Size: Large)   Pulse 63   Ht 5' 8 (1.727 m)   Wt 193 lb 9.6 oz (87.8 kg)   SpO2 96%   BMI 29.44 kg/m   GEN: Pleasant, interactive, well-appearing; obese; in no acute distress HEENT:   Normocephalic and atraumatic. PERRLA. Sclera white. Nasal turbinates pink, moist and patent bilaterally. No rhinorrhea present. Oropharynx pink and moist, without exudate or edema. No lesions, ulcerations, or postnasal drip. Mallampati III NECK:  Supple w/ fair ROM. No JVD present. Normal carotid impulses w/o bruits. Thyroid symmetrical with no goiter or nodules palpated. No lymphadenopathy.   CV: RRR, no m/r/g, no peripheral edema. Pulses intact, +2 bilaterally. No cyanosis, pallor or clubbing. PULMONARY:  Unlabored, regular breathing. Clear bilaterally A&P w/o wheezes/rales/rhonchi. No accessory muscle use.  GI: BS present and normoactive. Soft, non-tender to palpation. No organomegaly or masses detected.  MSK: No erythema, warmth or tenderness. Cap refil <2 sec all extrem. No deformities or joint swelling noted.  Neuro:  A/Ox3. No focal deficits noted.   Skin: Warm, no lesions or rashe Psych: Normal affect and behavior. Judgement and thought content appropriate.     Lab Results:  CBC    Component Value Date/Time   WBC 6.6 08/11/2023 0821   RBC 5.02 08/11/2023 0821   HGB 14.9 08/11/2023 0821   HCT 45.2 08/11/2023 0821   PLT 200.0 08/11/2023 0821   MCV 89.9 08/11/2023 0821   MCH 29.6 05/18/2019 0836   MCHC 33.1 08/11/2023 0821   RDW 13.9 08/11/2023 0821   LYMPHSABS 1,803 05/18/2019 0836   EOSABS 108 05/18/2019 0836   BASOSABS 39 05/18/2019 0836    BMET    Component Value Date/Time   NA 142 08/11/2023 0821   K 4.0 08/11/2023 0821   CL 104 08/11/2023 0821   CO2 28 08/11/2023 0821   GLUCOSE 75 08/11/2023 0821   BUN 22 08/11/2023 0821   CREATININE 1.30 08/11/2023 0821   CREATININE 1.31 (H) 03/20/2020 1403   CALCIUM  9.2 08/11/2023 0821   GFRNONAA 57 (L) 05/18/2019 0836   GFRAA 66 05/18/2019 0836    BNP No results found for: BNP   Imaging:  No results found.  methylPREDNISolone  sodium succinate (SOLU-MEDROL ) 125 mg/2 mL injection 125 mg     Date Action Dose Route  User   12/09/2023 1046 Given 125 mg Intravenous (Other) Cousar, Shamaine M, CMA           No data to display          No results found for: NITRICOXIDE      Assessment & Plan:   OSA (obstructive sleep apnea) Moderate OSA on CPAP. Excellent compliance and control. Receives benefit from use. Aware of proper care/use of device. Understands risks of untreated OSA. Safe driving practices reviewed.  Patient Instructions  Continue to use CPAP every night, minimum of 4-6 hours a night.  Change equipment as directed. Wash your tubing with warm soap and water daily, hang to dry. Wash humidifier portion weekly. Use bottled, distilled water and change daily Be aware of reduced alertness and do not drive or operate heavy machinery if experiencing this or drowsiness.  Exercise encouraged, as tolerated. Healthy weight management discussed.  Avoid or decrease alcohol consumption and medications that make you more sleepy, if possible. Notify if persistent daytime sleepiness occurs even with consistent use of PAP therapy.  Change CPAP supplies... Every month Mask cushions and/or nasal pillows CPAP machine filters Every 3 months Mask frame (not including the headgear) CPAP tubing Every 6 months Mask headgear Chin strap (if applicable) Humidifier water tub  Follow up in one year with Katie Nivaan Dicenzo,NP, or sooner, if needed    I spent 25 minutes of dedicated to the care of this patient on the date of this encounter to include pre-visit review of records, face-to-face time with the patient discussing conditions above, post visit ordering of testing, clinical documentation with the electronic health record, making appropriate referrals as documented, and communicating necessary findings to members of the patients care team.  Comer LULLA Rouleau, NP 12/30/2023  Pt aware and understands NP's role.

## 2023-12-31 DIAGNOSIS — M6281 Muscle weakness (generalized): Secondary | ICD-10-CM | POA: Diagnosis not present

## 2023-12-31 DIAGNOSIS — M545 Low back pain, unspecified: Secondary | ICD-10-CM | POA: Diagnosis not present

## 2023-12-31 DIAGNOSIS — M2569 Stiffness of other specified joint, not elsewhere classified: Secondary | ICD-10-CM | POA: Diagnosis not present

## 2024-01-02 ENCOUNTER — Other Ambulatory Visit: Payer: Self-pay | Admitting: Family Medicine

## 2024-01-02 DIAGNOSIS — M545 Low back pain, unspecified: Secondary | ICD-10-CM | POA: Diagnosis not present

## 2024-01-02 DIAGNOSIS — M2569 Stiffness of other specified joint, not elsewhere classified: Secondary | ICD-10-CM | POA: Diagnosis not present

## 2024-01-02 DIAGNOSIS — M6281 Muscle weakness (generalized): Secondary | ICD-10-CM | POA: Diagnosis not present

## 2024-01-05 ENCOUNTER — Ambulatory Visit
Admission: RE | Admit: 2024-01-05 | Discharge: 2024-01-05 | Disposition: A | Discharge status: Home / Self Care | Source: Ambulatory Visit | Attending: Student | Admitting: Student

## 2024-01-05 DIAGNOSIS — M47816 Spondylosis without myelopathy or radiculopathy, lumbar region: Secondary | ICD-10-CM

## 2024-01-05 DIAGNOSIS — M7138 Other bursal cyst, other site: Secondary | ICD-10-CM | POA: Diagnosis not present

## 2024-01-06 DIAGNOSIS — M545 Low back pain, unspecified: Secondary | ICD-10-CM | POA: Diagnosis not present

## 2024-01-06 DIAGNOSIS — M2569 Stiffness of other specified joint, not elsewhere classified: Secondary | ICD-10-CM | POA: Diagnosis not present

## 2024-01-06 DIAGNOSIS — M6281 Muscle weakness (generalized): Secondary | ICD-10-CM | POA: Diagnosis not present

## 2024-01-08 ENCOUNTER — Encounter: Payer: Self-pay | Admitting: Family Medicine

## 2024-01-08 ENCOUNTER — Encounter: Payer: Self-pay | Admitting: Student

## 2024-01-08 ENCOUNTER — Ambulatory Visit: Admitting: Student

## 2024-01-08 VITALS — BP 106/60 | HR 72 | Temp 98.0°F | Resp 12 | Ht 68.0 in | Wt 196.4 lb

## 2024-01-08 DIAGNOSIS — M2569 Stiffness of other specified joint, not elsewhere classified: Secondary | ICD-10-CM | POA: Diagnosis not present

## 2024-01-08 DIAGNOSIS — R0981 Nasal congestion: Secondary | ICD-10-CM

## 2024-01-08 DIAGNOSIS — I2510 Atherosclerotic heart disease of native coronary artery without angina pectoris: Secondary | ICD-10-CM | POA: Diagnosis not present

## 2024-01-08 DIAGNOSIS — M545 Low back pain, unspecified: Secondary | ICD-10-CM | POA: Diagnosis not present

## 2024-01-08 DIAGNOSIS — M6281 Muscle weakness (generalized): Secondary | ICD-10-CM | POA: Diagnosis not present

## 2024-01-08 MED ORDER — FLUTICASONE PROPIONATE 50 MCG/ACT NA SUSP: 2.0000 | Freq: Every day | NASAL | 1 refills | Status: AC

## 2024-01-08 MED ORDER — GUAIFENESIN ER 600 MG PO TB12: 1200.0000 mg | ORAL_TABLET | Freq: Two times a day (BID) | ORAL | 0 refills | Status: DC

## 2024-01-08 NOTE — Progress Notes (Signed)
 Chief Complaint  Patient presents with   Nasal Congestion    3-4 days    right ear discomfort    Todd Parks here for acute visit.   Duration: 3 days  Associated symptoms: ear pain and sore throat, nasal congestion, clear drainage Treatment to date: Zyrtec  Sick contacts: No, unknown  Patient denies fever, chills, SOB, CP, palpitations, dyspnea, edema, HA, vision changes, N/V/D, abdominal pain, urinary symptoms, rash, weight changes, and recent illness or hospitalizations.   Past Medical History:  Diagnosis Date   Diabetes mellitus without complication (HCC)    GERD (gastroesophageal reflux disease)    History of heart attack 2006   Hyperlipidemia    Hypertension     Objective BP 106/60 (BP Location: Left Arm, Patient Position: Sitting, Cuff Size: Normal)   Pulse 72   Temp 98 F (36.7 C) (Oral)   Resp 12   Ht 5' 8 (1.727 m)   Wt 196 lb 6.4 oz (89.1 kg)   SpO2 96%   BMI 29.86 kg/m  General: Awake, alert, appears stated age HEENT: AT, [place], ears patent b/l and TM's neg, nares patent w/o discharge, pharynx pink and without exudates, MMM Neck: No masses or asymmetry Heart: RRR Lungs: CTAB, no accessory muscle use Psych: Age appropriate judgment and insight, normal mood and affect  Nasal congestion - Plan: guaiFENesin  (MUCINEX ) 600 MG 12 hr tablet, fluticasone  (FLONASE ) 50 MCG/ACT nasal spray  Continue Zyrtec 10 mg daily and Flonase .   Rx-guaifenesin  (Mucinex ) Ensure adequate hydration. practice good hand hygiene.   F/u prn. If starting to experience fevers, shaking, or shortness of breath, seek immediate care. Pt voiced understanding and agreement to the plan.  Harlene LITTIE Jolly, DNP, AGNP-C 01/08/24 3:08 PM

## 2024-01-13 DIAGNOSIS — M6281 Muscle weakness (generalized): Secondary | ICD-10-CM | POA: Diagnosis not present

## 2024-01-13 DIAGNOSIS — M2569 Stiffness of other specified joint, not elsewhere classified: Secondary | ICD-10-CM | POA: Diagnosis not present

## 2024-01-13 DIAGNOSIS — M545 Low back pain, unspecified: Secondary | ICD-10-CM | POA: Diagnosis not present

## 2024-01-25 DIAGNOSIS — G4733 Obstructive sleep apnea (adult) (pediatric): Secondary | ICD-10-CM | POA: Diagnosis not present

## 2024-01-26 DIAGNOSIS — Z6830 Body mass index (BMI) 30.0-30.9, adult: Secondary | ICD-10-CM | POA: Diagnosis not present

## 2024-01-26 DIAGNOSIS — M48062 Spinal stenosis, lumbar region with neurogenic claudication: Secondary | ICD-10-CM | POA: Diagnosis not present

## 2024-01-31 ENCOUNTER — Other Ambulatory Visit: Payer: Self-pay | Admitting: Student

## 2024-01-31 DIAGNOSIS — R0981 Nasal congestion: Secondary | ICD-10-CM

## 2024-02-01 ENCOUNTER — Other Ambulatory Visit: Payer: Self-pay | Admitting: Family Medicine

## 2024-02-09 ENCOUNTER — Ambulatory Visit: Payer: Medicare PPO | Admitting: Family Medicine

## 2024-02-09 ENCOUNTER — Encounter: Payer: Self-pay | Admitting: Family Medicine

## 2024-02-09 VITALS — BP 124/72 | HR 62 | Temp 97.7°F | Resp 16 | Ht 68.0 in | Wt 193.0 lb

## 2024-02-09 DIAGNOSIS — I10 Essential (primary) hypertension: Secondary | ICD-10-CM | POA: Diagnosis not present

## 2024-02-09 DIAGNOSIS — E782 Mixed hyperlipidemia: Secondary | ICD-10-CM | POA: Diagnosis not present

## 2024-02-09 DIAGNOSIS — I25118 Atherosclerotic heart disease of native coronary artery with other forms of angina pectoris: Secondary | ICD-10-CM

## 2024-02-09 DIAGNOSIS — E1169 Type 2 diabetes mellitus with other specified complication: Secondary | ICD-10-CM | POA: Diagnosis not present

## 2024-02-09 DIAGNOSIS — E669 Obesity, unspecified: Secondary | ICD-10-CM | POA: Diagnosis not present

## 2024-02-09 DIAGNOSIS — Z7984 Long term (current) use of oral hypoglycemic drugs: Secondary | ICD-10-CM | POA: Diagnosis not present

## 2024-02-09 DIAGNOSIS — Z6829 Body mass index (BMI) 29.0-29.9, adult: Secondary | ICD-10-CM | POA: Diagnosis not present

## 2024-02-09 LAB — COMPREHENSIVE METABOLIC PANEL WITH GFR
ALT: 22 U/L (ref 0–53)
AST: 27 U/L (ref 0–37)
Albumin: 4.3 g/dL (ref 3.5–5.2)
Alkaline Phosphatase: 29 U/L — ABNORMAL LOW (ref 39–117)
BUN: 20 mg/dL (ref 6–23)
CO2: 29 meq/L (ref 19–32)
Calcium: 9.1 mg/dL (ref 8.4–10.5)
Chloride: 106 meq/L (ref 96–112)
Creatinine, Ser: 1.32 mg/dL (ref 0.40–1.50)
GFR: 53.55 mL/min — ABNORMAL LOW (ref >60.00)
Glucose, Bld: 73 mg/dL (ref 70–99)
Potassium: 4.4 meq/L (ref 3.5–5.1)
Sodium: 144 meq/L (ref 135–145)
Total Bilirubin: 0.7 mg/dL (ref 0.2–1.2)
Total Protein: 6.6 g/dL (ref 6.0–8.3)

## 2024-02-09 LAB — LIPID PANEL
Cholesterol: 132 mg/dL (ref 0–200)
HDL: 28.7 mg/dL — ABNORMAL LOW (ref >39.00)
LDL Cholesterol: 72 mg/dL (ref 0–99)
NonHDL: 103.35
Total CHOL/HDL Ratio: 5
Triglycerides: 156 mg/dL — ABNORMAL HIGH (ref 0.0–149.0)
VLDL: 31.2 mg/dL (ref 0.0–40.0)

## 2024-02-09 LAB — HEMOGLOBIN A1C: Hgb A1c MFr Bld: 6 % (ref 4.6–6.5)

## 2024-02-09 LAB — MICROALBUMIN / CREATININE URINE RATIO
Creatinine,U: 95.1 mg/dL
Microalb Creat Ratio: UNDETERMINED mg/g (ref 0.0–30.0)
Microalb, Ur: <0.7 mg/dL

## 2024-02-09 MED ORDER — EMPAGLIFLOZIN 25 MG PO TABS: 25.0000 mg | ORAL_TABLET | Freq: Every day | ORAL | 2 refills | Status: AC

## 2024-02-09 NOTE — Progress Notes (Signed)
 Subjective:   Chief Complaint  Patient presents with   Follow-up    Follow Up    Todd Parks is a 73 y.o. male here for follow-up of diabetes.   Todd Parks does not routinely check his sugars.  Patient does require insulin.  Tresiba  35 units nightly. Medications include: Metformin  1000 mg twice daily, Invokana 300 mg/d. Diet is good.  Exercise: Some walking, active in yard  Hypertension Patient presents for hypertension follow up. He does not monitor home blood pressures. He is compliant with medications-valsartan 160 mg daily, metoprolol XL 100 mg daily. Patient has these side effects of medication: none Diet/exercise as above. No chest pain or shortness of breath.  Mixed Hyperlipidemia Patient presents for mixed hyperlipidemia follow up. Currently being treated with Crestor  20 mg daily, fenofibrate  145 mg daily and compliance with treatment thus far has been good. He denies myalgias. Diet/exercise as above. The patient is known to have coexisting coronary artery disease.  Past Medical History:  Diagnosis Date   Diabetes mellitus without complication (HCC)    GERD (gastroesophageal reflux disease)    History of heart attack 2006   Hyperlipidemia    Hypertension      Related testing: Retinal exam: Done Pneumovax: done  Objective:  BP 124/72 (BP Location: Left Arm, Patient Position: Sitting)   Pulse 62   Temp 97.7 F (36.5 C) (Oral)   Resp 16   Ht 5' 8 (1.727 m)   Wt 193 lb (87.5 kg)   SpO2 96%   BMI 29.35 kg/m  General:  Well developed, well nourished, in no apparent distress Lungs:  CTAB, no access msc use Cardio:  RRR, no bruits, no LE edema Psych: Age appropriate judgment and insight  Assessment:   Type 2 diabetes mellitus with obesity (HCC) - Plan: Comprehensive metabolic panel with GFR, Hemoglobin A1c, Lipid panel, Microalbumin / creatinine urine ratio  Essential hypertension  Mixed hyperlipidemia  Coronary artery disease of native artery of native  heart with stable angina pectoris (HCC)   Plan:   Chronic, stable. Counseled on diet and exercise.  Continue Tresiba  35 units nightly, metformin  1000 mg twice daily.  I am going to change his Invokana to Jardiance  25 mg daily.  I think secondary outcomes are superior with Jardiance  or Todd Parks it looks like Jardiance  is cheaper than [place]. Chronic, stable.  Continue valsartan 160 mg daily, Toprol XL 100 mg daily. Chronic, stable.  Continue Crestor  20 mg daily, fenofibrate  145 mg daily. F/u in 6 mo. The patient voiced understanding and agreement to the plan.  Mabel Mt Hodges, DO 02/09/24 8:52 AM

## 2024-02-09 NOTE — Patient Instructions (Signed)
 Give us  2-3 business days to get the results of your labs back.   Keep the diet clean and stay active.  Let us  know if you need anything.

## 2024-02-10 ENCOUNTER — Ambulatory Visit: Payer: Self-pay | Admitting: Family Medicine

## 2024-02-19 ENCOUNTER — Encounter: Payer: Self-pay | Admitting: Podiatry

## 2024-02-19 ENCOUNTER — Ambulatory Visit: Admitting: Podiatry

## 2024-02-19 VITALS — Ht 68.0 in | Wt 193.0 lb

## 2024-02-19 DIAGNOSIS — M79675 Pain in left toe(s): Secondary | ICD-10-CM | POA: Diagnosis not present

## 2024-02-19 DIAGNOSIS — M79674 Pain in right toe(s): Secondary | ICD-10-CM

## 2024-02-19 DIAGNOSIS — E119 Type 2 diabetes mellitus without complications: Secondary | ICD-10-CM | POA: Diagnosis not present

## 2024-02-19 DIAGNOSIS — B351 Tinea unguium: Secondary | ICD-10-CM | POA: Diagnosis not present

## 2024-02-19 NOTE — Progress Notes (Signed)
 Subjective: Chief Complaint  Patient presents with   Nail Problem    Pt is here for [facility].    73 y.o. returns the office today for painful, elongated, thickened toenails which he cannot trim himself.  He has no other concerns. No open lesions.   Frann Mabel Mt, DO Last seen on 02/09/2024  A1c: 6.0 on 02/06/2024  Objective: AAO 3, NAD DP/PT pulses palpable, CRT less than 3 seconds Protective sensation decreased with Sallye Speed monofilament Nails hypertrophic, dystrophic, elongated, brittle, discolored 10. There is tenderness overlying the nails 1-5 bilaterally. There is no surrounding erythema or drainage along the nail sites. Dry skin present bilaterally.  No pain with calf compression, swelling, warmth, erythema.  Assessment: Patient presents with symptomatic onychomycosis  Plan: -Treatment options including alternatives, risks, complications were discussed -Nails sharply debrided 10 without complication/bleeding. -Continue moisturizer for the dry skin daily -Follow-up in 3 months or sooner if any problems are to arise. In the meantime, encouraged to call the office with any questions, concerns, changes symptoms.  Return in about 3 months (around 05/20/2024).   Donnice Fees, DPM

## 2024-02-24 DIAGNOSIS — G4733 Obstructive sleep apnea (adult) (pediatric): Secondary | ICD-10-CM | POA: Diagnosis not present

## 2024-02-25 DIAGNOSIS — G4733 Obstructive sleep apnea (adult) (pediatric): Secondary | ICD-10-CM | POA: Diagnosis not present

## 2024-03-09 ENCOUNTER — Other Ambulatory Visit: Payer: Self-pay | Admitting: Family Medicine

## 2024-03-22 DIAGNOSIS — H04123 Dry eye syndrome of bilateral lacrimal glands: Secondary | ICD-10-CM | POA: Diagnosis not present

## 2024-03-22 DIAGNOSIS — H40023 Open angle with borderline findings, high risk, bilateral: Secondary | ICD-10-CM | POA: Diagnosis not present

## 2024-03-22 DIAGNOSIS — H43822 Vitreomacular adhesion, left eye: Secondary | ICD-10-CM | POA: Diagnosis not present

## 2024-03-22 DIAGNOSIS — E119 Type 2 diabetes mellitus without complications: Secondary | ICD-10-CM | POA: Diagnosis not present

## 2024-03-22 DIAGNOSIS — H25813 Combined forms of age-related cataract, bilateral: Secondary | ICD-10-CM | POA: Diagnosis not present

## 2024-03-23 ENCOUNTER — Ambulatory Visit (INDEPENDENT_AMBULATORY_CARE_PROVIDER_SITE_OTHER): Payer: Medicare PPO

## 2024-03-23 VITALS — Ht 68.0 in | Wt 193.0 lb

## 2024-03-23 DIAGNOSIS — Z0000 Encounter for general adult medical examination without abnormal findings: Secondary | ICD-10-CM | POA: Diagnosis not present

## 2024-03-23 NOTE — Progress Notes (Signed)
 Subjective:   Todd Parks is a 73 y.o. who presents for a Medicare Wellness preventive visit.  As a reminder, Annual Wellness Visits don't include a physical exam, and some assessments may be limited, especially if this visit is performed virtually. We may recommend an in-person follow-up visit with your provider if needed.  Visit Complete: Virtual I connected with  Todd Parks on 03/23/24 by a video and audio enabled telemedicine application and verified that I am speaking with the correct person using two identifiers.  Patient Location: Home  Provider Location: Home Office  I discussed the limitations of evaluation and management by telemedicine. The patient expressed understanding and agreed to proceed.  Vital Signs: Because this visit was a virtual/telehealth visit, some criteria may be missing or patient reported. Any vitals not documented were not able to be obtained and vitals that have been documented are patient reported.    Persons Participating in Visit: Patient.  AWV Questionnaire: Yes: Patient Medicare AWV questionnaire was completed by the patient on 03/17/24; I have confirmed that all information answered by patient is correct and no changes since this date.  Cardiac Risk Factors include: diabetes mellitus;advanced age (>31men, >8 women);male gender;hypertension     Objective:    Today's Vitals   03/23/24 0914  Weight: 193 lb (87.5 kg)  Height: 5' 8 (1.727 m)   Body mass index is 29.35 kg/m.     03/23/2024    9:21 AM 03/20/2023    9:49 AM 03/14/2022    9:03 AM 03/12/2021    8:24 AM  Advanced Directives  Does Patient Have a Medical Advance Directive? Yes No Yes Yes  Type of Estate agent of [place];Living will  Healthcare Power of [place];Living will Healthcare Power of [place];Living will  Copy of Healthcare Power of Attorney in Chart? No - copy requested  No - copy requested No - copy requested  Would patient like information on  creating a medical advance directive?  No - Patient declined      Current Medications (verified) Outpatient Encounter Medications as of 03/23/2024  Medication Sig   ACCU-CHEK AVIVA PLUS test strip Use daily to check blood sugar.  DXE11.9   Accu-Chek Softclix Lancets lancets Use daily to check blood sugar..  DX E11.9   aspirin EC 81 MG tablet Take by mouth.   BD PEN NEEDLE NANO 2ND GEN 32G X 4 MM MISC USE ONCE DAILY WITH LEVEMIR  PEN   Blood Glucose Monitoring Suppl (FIFTY50 GLUCOSE METER 2.0) w/Device KIT Use as instructed   Cholecalciferol (VITAMIN D3) 50 MCG (2000 UT) capsule    Clobetasol Propionate 0.05 % shampoo APPLY TO AFFECTED AREA EVERY DAY   empagliflozin  (JARDIANCE ) 25 MG TABS tablet Take 1 tablet (25 mg total) by mouth daily.   fenofibrate  (TRICOR ) 145 MG tablet Take 1 tablet (145 mg total) by mouth daily.   fluticasone  (FLONASE ) 50 MCG/ACT nasal spray Place 2 sprays into both nostrils daily.   guaiFENesin  (MUCINEX ) 600 MG 12 hr tablet Take 2 tablets (1,200 mg total) by mouth 2 (two) times daily.   metFORMIN  (GLUCOPHAGE ) 1000 MG tablet TAKE 1 TABLET (1,000 MG TOTAL) BY MOUTH TWICE A DAY WITH MEALS   methocarbamol  (ROBAXIN ) 500 MG tablet Take 1 tablet (500 mg total) by mouth every 8 (eight) hours as needed for muscle spasms.   metoprolol succinate (TOPROL-XL) 100 MG 24 hr tablet TAKE 1 TABLET BY MOUTH EVERY DAY   nitroGLYCERIN (NITROSTAT) 0.4 MG SL tablet Place under the tongue.  omeprazole (PRILOSEC) 20 MG capsule TAKE 1 CAPSULE BY MOUTH EVERY DAY   rosuvastatin  (CRESTOR ) 20 MG tablet Take 1 tablet (20 mg total) by mouth daily.   TRESIBA  FLEXTOUCH 100 UNIT/ML FlexTouch Pen INJECT 35 UNITS INTO THE SKIN DAILY.   valsartan (DIOVAN) 160 MG tablet TAKE 1 TABLET BY MOUTH EVERY DAY   No facility-administered encounter medications on file as of 03/23/2024.    Allergies (verified) Patient has no known allergies.   History: Past Medical History:  Diagnosis Date   Diabetes mellitus  without complication (HCC)    GERD (gastroesophageal reflux disease)    History of heart attack 2006   Hyperlipidemia    Hypertension    Past Surgical History:  Procedure Laterality Date   BREAST SURGERY     mass   CARPAL TUNNEL RELEASE Bilateral    ROTATOR CUFF REPAIR Bilateral    VASECTOMY     Family History  Problem Relation Age of Onset   Cancer Neg Hx    Social History   Socioeconomic History   Marital status: Married    Spouse name: Not on file   Number of children: Not on file   Years of education: Not on file   Highest education level: Associate degree: academic program  Occupational History   Not on file  Tobacco Use   Smoking status: Never   Smokeless tobacco: Never  Substance and Sexual Activity   Alcohol use: Never   Drug use: Never   Sexual activity: Not on file  Other Topics Concern   Not on file  Social History Narrative   Not on file   Social Drivers of Health   Financial Resource Strain: Low Risk  (03/23/2024)   Overall Financial Resource Strain (CARDIA)    Difficulty of Paying Living Expenses: Not hard at all  Food Insecurity: No Food Insecurity (03/23/2024)   Hunger Vital Sign    Worried About Running Out of Food in the Last Year: Never true    Ran Out of Food in the Last Year: Never true  Transportation Needs: No Transportation Needs (03/23/2024)   PRAPARE - Administrator, Civil Service (Medical): No    Lack of Transportation (Non-Medical): No  Physical Activity: Inactive (03/23/2024)   Exercise Vital Sign    Days of Exercise per Week: 0 days    Minutes of Exercise per Session: 0 min  Stress: No Stress Concern Present (03/23/2024)   [employer] of Occupational Health - Occupational Stress Questionnaire    Feeling of Stress: Not at all  Social Connections: Socially Integrated (03/23/2024)   Social Connection and Isolation Panel    Frequency of Communication with Friends and Family: More than three times a week    Frequency  of Social Gatherings with Friends and Family: More than three times a week    Attends Religious Services: More than 4 times per year    Active Member of [employer] or Organizations: Yes    Attends Engineer, structural: More than 4 times per year    Marital Status: Married    Tobacco Counseling Counseling given: Not Answered    Clinical Intake:  Pre-visit preparation completed: Yes  Pain : No/denies pain     BMI - recorded: 29.35 Nutritional Status: BMI 25 -29 Overweight Nutritional Risks: None Diabetes: Yes CBG done?: No Did pt. bring in CBG monitor from home?: No  Lab Results  Component Value Date   HGBA1C 6.0 02/09/2024   HGBA1C 6.2 08/11/2023  HGBA1C 6.0 02/05/2023     How often do you need to have someone help you when you read instructions, pamphlets, or other written materials from your doctor or pharmacy?: 1 - Never  Interpreter Needed?: No  Information entered by :: Todd Blush LPN   Activities of Daily Living     03/23/2024    9:19 AM 03/17/2024    7:58 AM  In your present state of health, do you have any difficulty performing the following activities:  Hearing? 0 0  Vision? 0 0  Difficulty concentrating or making decisions? 0 0  Walking or climbing stairs? 0 0  Dressing or bathing? 0 0  Doing errands, shopping? 0 0  Preparing Food and eating ? N N  Using the Toilet? N N  In the past six months, have you accidently leaked urine? N N  Do you have problems with loss of bowel control? N N  Managing your Medications? N N  Managing your Finances? N N  Housekeeping or managing your Housekeeping? N N    Patient Care Team: Frann Mabel Mt, DO as PCP - General (Family Medicine)  I have updated your Care Teams any recent Medical Services you may have received from other providers in the past year.     Assessment:   This is a routine wellness examination for Todd Parks.  Hearing/Vision screen Hearing Screening - Comments:: Wears Hearing  Aids Vision Screening - Comments:: Wears rx glasses - up to date with routine eye exams with  [facility]   Goals Addressed               This Visit's Progress     Remain active (pt-stated)         Depression Screen     03/23/2024    9:19 AM 02/09/2024    8:20 AM 03/20/2023    9:46 AM 07/01/2022    8:29 AM 03/14/2022    9:04 AM 12/24/2021    7:25 AM 03/12/2021    8:26 AM  PHQ 2/9 Scores  PHQ - 2 Score 0 0 0 0 0 0 0  PHQ- 9 Score 0 0 0 0       Fall Risk     03/23/2024    9:20 AM 03/17/2024    7:58 AM 03/20/2023    9:50 AM 03/19/2023   10:39 AM 07/01/2022    8:28 AM  Fall Risk   Falls in the past year? 0 0 0 0 0  Number falls in past yr: 0  0 0 0  Injury with Fall? 0  0 0 0  Risk for fall due to : No Fall Risks  No Fall Risks  History of fall(s)  Follow up Falls evaluation completed  Falls prevention discussed;Falls evaluation completed  Falls evaluation completed      Data saved with a previous flowsheet row definition    MEDICARE RISK AT HOME:  Medicare Risk at Home Any stairs in or around the home?: No If so, are there any without handrails?: No Home free of loose throw rugs in walkways, pet beds, electrical cords, etc?: Yes Adequate lighting in your home to reduce risk of falls?: Yes Life alert?: No Use of a cane, walker or w/c?: No Grab bars in the bathroom?: No Shower chair or bench in shower?: No Elevated toilet seat or a handicapped toilet?: Yes  TIMED UP AND GO:  Was the test performed?  No  Cognitive Function: 6CIT completed  03/23/2024    9:20 AM 03/20/2023    9:51 AM 03/14/2022    9:10 AM  6CIT Screen  What Year? 0 points 0 points 0 points  What month? 0 points 0 points 0 points  What time? 0 points 0 points 0 points  Count back from 20 0 points 0 points 0 points  Months in reverse 0 points 0 points 0 points  Repeat phrase 0 points 0 points 0 points  Total Score 0 points 0 points 0 points    Immunizations Immunization History   Administered Date(s) Administered   Fluad Quad(high Dose 65+) 03/20/2020, 04/09/2021, 07/01/2022   INFLUENZA, HIGH DOSE SEASONAL PF 04/03/2016, 04/03/2017, 04/06/2018, 03/31/2019   PNEUMOCOCCAL CONJUGATE-20 07/21/2020   Pneumococcal Conjugate-13 04/03/2016   Pneumococcal Polysaccharide-23 04/03/2017   Tdap 06/17/2012, 07/01/2022   Zoster Recombinant(Shingrix) 04/01/2019, 07/02/2019   Zoster, Live 06/18/2011    Screening Tests Health Maintenance  Topic Date Due   Influenza Vaccine  01/16/2024   FOOT EXAM  08/10/2024   HEMOGLOBIN A1C  08/11/2024   OPHTHALMOLOGY EXAM  10/22/2024   Diabetic kidney evaluation - eGFR measurement  02/08/2025   Diabetic kidney evaluation - Urine ACR  02/08/2025   Medicare Annual Wellness (AWV)  03/23/2025   Colonoscopy  11/30/2029   DTaP/Tdap/Td (3 - Td or Tdap) 07/01/2032   Pneumococcal Vaccine: 50+ Years  Completed   Hepatitis C Screening  Completed   Zoster Vaccines- Shingrix  Completed   Meningococcal B Vaccine  Aged Out   COVID-19 Vaccine  Discontinued    Health Maintenance Items Addressed:   Additional Screening:  Vision Screening: Recommended annual ophthalmology exams for early detection of glaucoma and other disorders of the eye. Is the patient up to date with their annual eye exam?  Yes  Who is the provider or what is the name of the office in which the patient attends annual eye exams? [facility]  Dental Screening: Recommended annual dental exams for proper oral hygiene  Community Resource Referral / Chronic Care Management: CRR required this visit?  No   CCM required this visit?  No   Plan:    I have personally reviewed and noted the following in the patient's chart:   Medical and social history Use of alcohol, tobacco or illicit drugs  Current medications and supplements including opioid prescriptions. Patient is not currently taking opioid prescriptions. Functional ability and status Nutritional status Physical  activity Advanced directives List of other physicians Hospitalizations, surgeries, and ER visits in previous 12 months Vitals Screenings to include cognitive, depression, and falls Referrals and appointments  In addition, I have reviewed and discussed with patient certain preventive protocols, quality metrics, and best practice recommendations. A written personalized care plan for preventive services as well as general preventive health recommendations were provided to patient.   Todd LELON Blush, LPN   [id number]/07/[id number]   After Visit Summary: (MyChart) Due to this being a telephonic visit, the after visit summary with patients personalized plan was offered to patient via MyChart   Notes: Nothing significant to report at this time.

## 2024-03-23 NOTE — Patient Instructions (Addendum)
 Todd Parks,  Thank you for taking the time for your Medicare Wellness Visit. I appreciate your continued commitment to your health goals. Please review the care plan we discussed, and feel free to reach out if I can assist you further.  Medicare recommends these wellness visits once per year to help you and your care team stay ahead of potential health issues. These visits are designed to focus on prevention, allowing your provider to concentrate on managing your acute and chronic conditions during your regular appointments.  Please note that Annual Wellness Visits do not include a physical exam. Some assessments may be limited, especially if the visit was conducted virtually. If needed, we may recommend a separate in-person follow-up with your provider.  Ongoing Care Seeing your primary care provider every 3 to 6 months helps us  monitor your health and provide consistent, personalized care.   Referrals If a referral was made during today's visit and you haven't received any updates within two weeks, please contact the referred provider directly to check on the status.  Recommended Screenings:  Health Maintenance  Topic Date Due   Flu Shot  01/16/2024   Complete foot exam   08/10/2024   Hemoglobin A1C  08/11/2024   Eye exam for diabetics  10/22/2024   Yearly kidney function blood test for diabetes  02/08/2025   Yearly kidney health urinalysis for diabetes  02/08/2025   Medicare Annual Wellness Visit  03/23/2025   Colon Cancer Screening  11/30/2029   DTaP/Tdap/Td vaccine (3 - Td or Tdap) 07/01/2032   Pneumococcal Vaccine for age over 43  Completed   Hepatitis C Screening  Completed   Zoster (Shingles) Vaccine  Completed   Meningitis B Vaccine  Aged Out   COVID-19 Vaccine  Discontinued       03/23/2024    9:21 AM  Advanced Directives  Does Patient Have a Medical Advance Directive? Yes  Type of Estate agent of [place];Living will  Copy of Healthcare Power of  Attorney in Chart? No - copy requested   Advance Care Planning is important because it: Ensures you receive medical care that aligns with your values, goals, and preferences. Provides guidance to your family and loved ones, reducing the emotional burden of decision-making during critical moments.  Vision: Annual vision screenings are recommended for early detection of glaucoma, cataracts, and diabetic retinopathy. These exams can also reveal signs of chronic conditions such as diabetes and high blood pressure.  Dental: Annual dental screenings help detect early signs of oral cancer, gum disease, and other conditions linked to overall health, including heart disease and diabetes.  Please see the attached documents for additional preventive care recommendations.

## 2024-03-26 DIAGNOSIS — G4733 Obstructive sleep apnea (adult) (pediatric): Secondary | ICD-10-CM | POA: Diagnosis not present

## 2024-04-29 ENCOUNTER — Other Ambulatory Visit: Payer: Self-pay | Admitting: Family Medicine

## 2024-05-20 ENCOUNTER — Ambulatory Visit: Admitting: Podiatry

## 2024-05-20 ENCOUNTER — Encounter: Payer: Self-pay | Admitting: Podiatry

## 2024-05-20 VITALS — Ht 68.0 in | Wt 193.0 lb

## 2024-05-20 DIAGNOSIS — M79674 Pain in right toe(s): Secondary | ICD-10-CM | POA: Diagnosis not present

## 2024-05-20 DIAGNOSIS — B351 Tinea unguium: Secondary | ICD-10-CM | POA: Diagnosis not present

## 2024-05-20 DIAGNOSIS — M79675 Pain in left toe(s): Secondary | ICD-10-CM | POA: Diagnosis not present

## 2024-05-23 NOTE — Progress Notes (Signed)
 Subjective: Chief Complaint  Patient presents with   Nail Problem    Pt is here for [facility].    73 y.o. returns the office today for painful, elongated, thickened toenails which he cannot trim himself.  He has no other concerns. No open lesions.   Frann Mabel Mt, DO Last seen on 02/09/2024  A1c: 6.0 on 02/09/2024  Objective: AAO 3, NAD DP/PT pulses palpable, CRT less than 3 seconds Protective sensation decreased with Sallye Speed monofilament Nails hypertrophic, dystrophic, elongated, brittle, discolored 10. There is tenderness overlying the nails 1-5 bilaterally. There is no surrounding erythema or drainage along the nail sites. Dry skin present bilaterally.  No pain with calf compression, swelling, warmth, erythema.  Assessment: Patient presents with symptomatic onychomycosis  Plan: -Treatment options including alternatives, risks, complications were discussed -Nails sharply debrided 10 without complication/bleeding. -Continue moisturizer for the dry skin daily -Follow-up in 3 months or sooner if any problems are to arise. In the meantime, encouraged to call the office with any questions, concerns, changes symptoms.  Return in about 3 months (around 08/18/2024).  Donnice Fees, DPM

## 2024-06-09 ENCOUNTER — Other Ambulatory Visit: Payer: Self-pay | Admitting: Family Medicine

## 2024-06-22 ENCOUNTER — Other Ambulatory Visit: Payer: Self-pay | Admitting: Family Medicine

## 2024-08-11 ENCOUNTER — Encounter: Admitting: Family Medicine

## 2024-08-17 ENCOUNTER — Ambulatory Visit: Admitting: Podiatry

## 2025-03-31 ENCOUNTER — Ambulatory Visit
